# Patient Record
Sex: Female | Born: 1960
Health system: Southern US, Community
[De-identification: ages and names within clinical notes are randomized; demographics above are authoritative.]

## PROBLEM LIST (undated history)

## (undated) DIAGNOSIS — K649 Unspecified hemorrhoids: Secondary | ICD-10-CM

## (undated) DIAGNOSIS — I471 Supraventricular tachycardia, unspecified: Secondary | ICD-10-CM

## (undated) DIAGNOSIS — K449 Diaphragmatic hernia without obstruction or gangrene: Secondary | ICD-10-CM

## (undated) DIAGNOSIS — I10 Essential (primary) hypertension: Secondary | ICD-10-CM

## (undated) DIAGNOSIS — E669 Obesity, unspecified: Secondary | ICD-10-CM

## (undated) DIAGNOSIS — K219 Gastro-esophageal reflux disease without esophagitis: Secondary | ICD-10-CM

## (undated) DIAGNOSIS — E785 Hyperlipidemia, unspecified: Secondary | ICD-10-CM

## (undated) DIAGNOSIS — K635 Polyp of colon: Secondary | ICD-10-CM

## (undated) HISTORY — DX: Obesity, unspecified: E66.9

## (undated) HISTORY — DX: Essential (primary) hypertension: I10

## (undated) HISTORY — DX: Unspecified hemorrhoids: K64.9

## (undated) HISTORY — DX: Polyp of colon: K63.5

## (undated) HISTORY — DX: Hyperlipidemia, unspecified: E78.5

## (undated) HISTORY — DX: Diaphragmatic hernia without obstruction or gangrene: K44.9

## (undated) HISTORY — DX: Supraventricular tachycardia: I47.1

## (undated) HISTORY — DX: Supraventricular tachycardia, unspecified: I47.10

---

## 1998-02-11 ENCOUNTER — Ambulatory Visit (HOSPITAL_COMMUNITY): Admission: RE | Admit: 1998-02-11 | Discharge: 1998-02-11 | Payer: Self-pay | Admitting: Obstetrics & Gynecology

## 1999-03-07 ENCOUNTER — Ambulatory Visit (HOSPITAL_COMMUNITY): Admission: RE | Admit: 1999-03-07 | Discharge: 1999-03-07 | Payer: Self-pay | Admitting: General Surgery

## 1999-03-07 ENCOUNTER — Encounter (HOSPITAL_BASED_OUTPATIENT_CLINIC_OR_DEPARTMENT_OTHER): Payer: Self-pay | Admitting: General Surgery

## 1999-03-14 ENCOUNTER — Ambulatory Visit (HOSPITAL_COMMUNITY): Admission: RE | Admit: 1999-03-14 | Discharge: 1999-03-14 | Payer: Self-pay | Admitting: General Surgery

## 1999-03-14 ENCOUNTER — Encounter (HOSPITAL_BASED_OUTPATIENT_CLINIC_OR_DEPARTMENT_OTHER): Payer: Self-pay | Admitting: General Surgery

## 1999-04-27 ENCOUNTER — Encounter: Admission: RE | Admit: 1999-04-27 | Discharge: 1999-04-27 | Payer: Self-pay | Admitting: Obstetrics

## 1999-06-08 ENCOUNTER — Other Ambulatory Visit: Admission: RE | Admit: 1999-06-08 | Discharge: 1999-06-08 | Payer: Self-pay | Admitting: *Deleted

## 1999-06-08 ENCOUNTER — Encounter: Admission: RE | Admit: 1999-06-08 | Discharge: 1999-06-08 | Payer: Self-pay | Admitting: Obstetrics

## 1999-09-01 ENCOUNTER — Other Ambulatory Visit: Admission: RE | Admit: 1999-09-01 | Discharge: 1999-09-01 | Payer: Self-pay | Admitting: *Deleted

## 1999-09-01 ENCOUNTER — Encounter (INDEPENDENT_AMBULATORY_CARE_PROVIDER_SITE_OTHER): Payer: Self-pay | Admitting: Specialist

## 1999-11-12 ENCOUNTER — Emergency Department (HOSPITAL_COMMUNITY): Admission: EM | Admit: 1999-11-12 | Discharge: 1999-11-12 | Payer: Self-pay | Admitting: Emergency Medicine

## 1999-11-23 ENCOUNTER — Emergency Department (HOSPITAL_COMMUNITY): Admission: EM | Admit: 1999-11-23 | Discharge: 1999-11-23 | Payer: Self-pay | Admitting: Emergency Medicine

## 2000-01-01 HISTORY — PX: ABDOMINAL HYSTERECTOMY: SHX81

## 2000-04-03 ENCOUNTER — Inpatient Hospital Stay (HOSPITAL_COMMUNITY): Admission: RE | Admit: 2000-04-03 | Discharge: 2000-04-05 | Payer: Self-pay | Admitting: Obstetrics and Gynecology

## 2000-04-03 ENCOUNTER — Encounter (INDEPENDENT_AMBULATORY_CARE_PROVIDER_SITE_OTHER): Payer: Self-pay | Admitting: Specialist

## 2000-07-06 ENCOUNTER — Emergency Department (HOSPITAL_COMMUNITY): Admission: EM | Admit: 2000-07-06 | Discharge: 2000-07-06 | Payer: Self-pay | Admitting: Emergency Medicine

## 2000-07-06 ENCOUNTER — Encounter: Payer: Self-pay | Admitting: Emergency Medicine

## 2002-05-18 ENCOUNTER — Emergency Department (HOSPITAL_COMMUNITY): Admission: EM | Admit: 2002-05-18 | Discharge: 2002-05-18 | Payer: Self-pay

## 2002-08-26 ENCOUNTER — Other Ambulatory Visit: Admission: RE | Admit: 2002-08-26 | Discharge: 2002-08-26 | Payer: Self-pay | Admitting: Obstetrics and Gynecology

## 2002-08-27 ENCOUNTER — Encounter: Admission: RE | Admit: 2002-08-27 | Discharge: 2002-08-27 | Payer: Self-pay | Admitting: Obstetrics and Gynecology

## 2002-08-27 ENCOUNTER — Encounter: Payer: Self-pay | Admitting: Obstetrics and Gynecology

## 2003-03-02 ENCOUNTER — Other Ambulatory Visit: Admission: RE | Admit: 2003-03-02 | Discharge: 2003-03-02 | Payer: Self-pay | Admitting: Obstetrics and Gynecology

## 2003-10-29 ENCOUNTER — Emergency Department (HOSPITAL_COMMUNITY): Admission: EM | Admit: 2003-10-29 | Discharge: 2003-10-29 | Payer: Self-pay | Admitting: Emergency Medicine

## 2004-02-24 ENCOUNTER — Emergency Department (HOSPITAL_COMMUNITY): Admission: EM | Admit: 2004-02-24 | Discharge: 2004-02-24 | Payer: Self-pay | Admitting: Emergency Medicine

## 2004-07-12 ENCOUNTER — Ambulatory Visit (HOSPITAL_COMMUNITY): Admission: RE | Admit: 2004-07-12 | Discharge: 2004-07-12 | Payer: Self-pay | Admitting: Internal Medicine

## 2004-07-12 ENCOUNTER — Encounter: Payer: Self-pay | Admitting: Internal Medicine

## 2005-01-22 ENCOUNTER — Ambulatory Visit (HOSPITAL_COMMUNITY): Admission: RE | Admit: 2005-01-22 | Discharge: 2005-01-22 | Payer: Self-pay | Admitting: Obstetrics and Gynecology

## 2005-01-22 ENCOUNTER — Other Ambulatory Visit: Admission: RE | Admit: 2005-01-22 | Discharge: 2005-01-22 | Payer: Self-pay | Admitting: Obstetrics and Gynecology

## 2005-03-02 ENCOUNTER — Encounter: Admission: RE | Admit: 2005-03-02 | Discharge: 2005-03-02 | Payer: Self-pay | Admitting: Obstetrics and Gynecology

## 2005-05-06 ENCOUNTER — Emergency Department (HOSPITAL_COMMUNITY): Admission: EM | Admit: 2005-05-06 | Discharge: 2005-05-06 | Payer: Self-pay | Admitting: Emergency Medicine

## 2005-08-31 ENCOUNTER — Emergency Department (HOSPITAL_COMMUNITY): Admission: EM | Admit: 2005-08-31 | Discharge: 2005-08-31 | Payer: Self-pay | Admitting: Emergency Medicine

## 2006-04-01 ENCOUNTER — Emergency Department (HOSPITAL_COMMUNITY): Admission: EM | Admit: 2006-04-01 | Discharge: 2006-04-01 | Payer: Self-pay | Admitting: Emergency Medicine

## 2006-05-30 ENCOUNTER — Emergency Department (HOSPITAL_COMMUNITY): Admission: EM | Admit: 2006-05-30 | Discharge: 2006-05-30 | Payer: Self-pay | Admitting: Emergency Medicine

## 2006-06-02 ENCOUNTER — Emergency Department (HOSPITAL_COMMUNITY): Admission: EM | Admit: 2006-06-02 | Discharge: 2006-06-02 | Payer: Self-pay | Admitting: Emergency Medicine

## 2007-01-15 ENCOUNTER — Emergency Department (HOSPITAL_COMMUNITY): Admission: EM | Admit: 2007-01-15 | Discharge: 2007-01-15 | Payer: Self-pay | Admitting: Emergency Medicine

## 2007-09-30 ENCOUNTER — Ambulatory Visit: Payer: Self-pay | Admitting: Internal Medicine

## 2008-03-09 DIAGNOSIS — R079 Chest pain, unspecified: Secondary | ICD-10-CM

## 2008-03-09 DIAGNOSIS — R1013 Epigastric pain: Secondary | ICD-10-CM | POA: Insufficient documentation

## 2008-03-09 DIAGNOSIS — I10 Essential (primary) hypertension: Secondary | ICD-10-CM | POA: Insufficient documentation

## 2008-03-09 DIAGNOSIS — K222 Esophageal obstruction: Secondary | ICD-10-CM | POA: Insufficient documentation

## 2008-03-09 DIAGNOSIS — K210 Gastro-esophageal reflux disease with esophagitis: Secondary | ICD-10-CM

## 2008-03-09 DIAGNOSIS — Z8679 Personal history of other diseases of the circulatory system: Secondary | ICD-10-CM | POA: Insufficient documentation

## 2008-03-09 DIAGNOSIS — E669 Obesity, unspecified: Secondary | ICD-10-CM

## 2008-03-09 DIAGNOSIS — K219 Gastro-esophageal reflux disease without esophagitis: Secondary | ICD-10-CM | POA: Insufficient documentation

## 2008-03-09 DIAGNOSIS — K449 Diaphragmatic hernia without obstruction or gangrene: Secondary | ICD-10-CM

## 2008-04-21 ENCOUNTER — Encounter: Admission: RE | Admit: 2008-04-21 | Discharge: 2008-04-21 | Payer: Self-pay | Admitting: Internal Medicine

## 2010-02-16 ENCOUNTER — Encounter: Admission: RE | Admit: 2010-02-16 | Discharge: 2010-02-16 | Payer: Self-pay | Admitting: Gynecology

## 2010-02-17 ENCOUNTER — Other Ambulatory Visit: Admission: RE | Admit: 2010-02-17 | Discharge: 2010-02-17 | Payer: Self-pay | Admitting: Gynecology

## 2010-02-17 ENCOUNTER — Ambulatory Visit: Payer: Self-pay | Admitting: Gynecology

## 2010-02-28 ENCOUNTER — Ambulatory Visit: Payer: Self-pay | Admitting: Gynecology

## 2010-08-29 ENCOUNTER — Ambulatory Visit: Payer: Self-pay | Admitting: Gynecology

## 2010-11-06 ENCOUNTER — Encounter (INDEPENDENT_AMBULATORY_CARE_PROVIDER_SITE_OTHER): Payer: Self-pay | Admitting: *Deleted

## 2010-11-29 ENCOUNTER — Encounter (INDEPENDENT_AMBULATORY_CARE_PROVIDER_SITE_OTHER): Payer: Self-pay | Admitting: *Deleted

## 2010-11-30 ENCOUNTER — Ambulatory Visit: Payer: Self-pay | Admitting: Internal Medicine

## 2010-12-12 ENCOUNTER — Ambulatory Visit: Payer: Self-pay | Admitting: Internal Medicine

## 2010-12-15 ENCOUNTER — Encounter: Payer: Self-pay | Admitting: Internal Medicine

## 2011-01-20 ENCOUNTER — Other Ambulatory Visit: Payer: Self-pay | Admitting: Gynecology

## 2011-01-20 DIAGNOSIS — Z1239 Encounter for other screening for malignant neoplasm of breast: Secondary | ICD-10-CM

## 2011-01-21 ENCOUNTER — Encounter: Payer: Self-pay | Admitting: Obstetrics and Gynecology

## 2011-01-21 ENCOUNTER — Encounter: Payer: Self-pay | Admitting: Internal Medicine

## 2011-01-21 ENCOUNTER — Encounter: Payer: Self-pay | Admitting: *Deleted

## 2011-01-30 NOTE — Miscellaneous (Signed)
Summary: [facility]  Clinical Lists Changes  Medications: Added new medication of MOVIPREP 100 GM  SOLR (PEG-KCL-NACL-NASULF-NA ASC-C) As per prep instructions. - Signed Rx of MOVIPREP 100 GM  SOLR (PEG-KCL-NACL-NASULF-NA ASC-C) As per prep instructions.;  #1 x 0;  Signed;  Entered by: Ezra Sites RN;  Authorized by: Hilarie Fredrickson MD;  Method used: Electronically to [facility]. [phone]*, [address], [place], [place]  [address], Ph: [phone], Fax: [phone] Observations: Added new observation of NKA: T (11/30/2010 16:06)    Prescriptions: MOVIPREP 100 GM  SOLR (PEG-KCL-NACL-NASULF-NA ASC-C) As per prep instructions.  #1 x 0   Entered by:   Ezra Sites RN   Authorized by:   Hilarie Fredrickson MD   Signed by:   Ezra Sites RN on 11/30/2010   Method used:   Electronically to        [facility] [address] [phone]* (retail)       [address] [place], [place]  [address]       Ph: [phone]       Fax: [phone]   RxID:   [id number]

## 2011-01-30 NOTE — Letter (Signed)
Summary: [facility] Instructions  [facility]  [address] [place], [place] [address]   Phone: [phone]  Fax: [phone]       ANARI EVITT    February 09, 1970    MRN: [id number]        Procedure Day /Date:  Tuesday 12/12/2010     Arrival Time: 12:30 pm      Procedure Time: 1:30 pm     Location of Procedure:                    _x _  [facility] (4th Floor)                        PREPARATION FOR COLONOSCOPY WITH MOVIPREP   Starting 5 days prior to your procedure Thursday 12/8 do not eat nuts, seeds, popcorn, corn, beans, peas,  salads, or any raw vegetables.  Do not take any fiber supplements (e.g. Metamucil, Citrucel, and Benefiber).  THE DAY BEFORE YOUR PROCEDURE         DATE: Monday 12/12  1.  Drink clear liquids the entire day-NO SOLID FOOD  2.  Do not drink anything colored red or purple.  Avoid juices with pulp.  No orange juice.  3.  Drink at least 64 oz. (8 glasses) of fluid/clear liquids during the day to prevent dehydration and help the prep work efficiently.  CLEAR LIQUIDS INCLUDE: Water Jello Ice Popsicles Tea (sugar ok, no milk/cream) Powdered fruit flavored drinks Coffee (sugar ok, no milk/cream) Gatorade Juice: apple, white grape, white cranberry  Lemonade Clear bullion, consomm, broth Carbonated beverages (any kind) Strained chicken noodle soup Hard Candy                             4.  In the morning, mix first dose of MoviPrep solution:    Empty 1 Pouch A and 1 Pouch B into the disposable container    Add lukewarm drinking water to the top line of the container. Mix to dissolve    Refrigerate (mixed solution should be used within 24 hrs)  5.  Begin drinking the prep at 5:00 p.m. The MoviPrep container is divided by 4 marks.   Every 15 minutes drink the solution down to the next mark (approximately 8 oz) until the full liter is complete.   6.  Follow completed prep with 16 oz of clear liquid of your choice  (Nothing red or purple).  Continue to drink clear liquids until bedtime.  7.  Before going to bed, mix second dose of MoviPrep solution:    Empty 1 Pouch A and 1 Pouch B into the disposable container    Add lukewarm drinking water to the top line of the container. Mix to dissolve    Refrigerate  THE DAY OF YOUR PROCEDURE      DATE: Tuesday 12/13  Beginning at 8:30 a.m. (5 hours before procedure):         1. Every 15 minutes, drink the solution down to the next mark (approx 8 oz) until the full liter is complete.  2. Follow completed prep with 16 oz. of clear liquid of your choice.    3. You may drink clear liquids until 11:30 am (2 HOURS BEFORE PROCEDURE).   MEDICATION INSTRUCTIONS  Unless otherwise instructed, you should take regular prescription medications with a small sip of water   as early as possible the morning of  your procedure.           OTHER INSTRUCTIONS  You will need a responsible adult at least 51 years of age to accompany you and drive you home.   This person must remain in the waiting room during your procedure.  Wear loose fitting clothing that is easily removed.  Leave jewelry and other valuables at home.  However, you may wish to bring a book to read or  an iPod/MP3 player to listen to music as you wait for your procedure to start.  Remove all body piercing jewelry and leave at home.  Total time from sign-in until discharge is approximately 2-3 hours.  You should go home directly after your procedure and rest.  You can resume normal activities the  day after your procedure.  The day of your procedure you should not:   Drive   Make legal decisions   Operate machinery   Drink alcohol   Return to work  You will receive specific instructions about eating, activities and medications before you leave.    The above instructions have been reviewed and explained to me by   Ezra Sites RN  November 30, 2010 4:27 PM     I fully understand  and can verbalize these instructions _____________________________ Date _________

## 2011-01-30 NOTE — Letter (Signed)
Summary: Pre Visit Letter Revised  [facility]  [address] [place], [place] [address]   Phone: [phone]  Fax: [phone]        11/06/2010 MRN: [id number] Anita Hughes [address] [place], [place]  [address]             Procedure Date:  12/12/2010   Welcome to the Gastroenterology Division at [facility].    You are scheduled to see a nurse for your pre-procedure visit on 11/30/2010 at 4:30PM on the 3rd floor at [facility], [address]  We ask that you try to arrive at our office 15 minutes prior to your appointment time to allow for check-in.  Please take a minute to review the attached form.  If you answer "Yes" to one or more of the questions on the first page, we ask that you call the person listed at your earliest opportunity.  If you answer "No" to all of the questions, please complete the rest of the form and bring it to your appointment.    Your nurse visit will consist of discussing your medical and surgical history, your immediate family medical history, and your medications.   If you are unable to list all of your medications on the form, please bring the medication bottles to your appointment and we will list them.  We will need to be aware of both prescribed and over the counter drugs.  We will need to know exact dosage information as well.    Please be prepared to read and sign documents such as consent forms, a financial agreement, and acknowledgement forms.  If necessary, and with your consent, a friend or relative is welcome to sit-in on the nurse visit with you.  Please bring your insurance card so that we may make a copy of it.  If your insurance requires a referral to see a specialist, please bring your referral form from your primary care physician.  No co-pay is required for this nurse visit.     If you cannot keep your appointment, please call [phone] to cancel or reschedule prior to your appointment date.  This  allows [place] the opportunity to schedule an appointment for another patient in need of care.    Thank you for choosing [facility] for your medical needs.  We appreciate the opportunity to care for you.  Please visit [place] at our website  to learn more about our practice.  Sincerely, The Gastroenterology Division

## 2011-02-01 NOTE — Procedures (Signed)
Summary: Colonoscopy  Patient: Anita Hughes Note: All result statuses are Final unless otherwise noted.  Tests: (1) Colonoscopy (COL)   COL Colonoscopy           DONE     [facility]     [address]     [place], [place]  [address]           COLONOSCOPY PROCEDURE REPORT           PATIENT:  [name], [name]  MR#:  [id number]     BIRTHDATE:  24-Aug-1960, 50 yrs. old  GENDER:  female     ENDOSCOPIST:  Wilhemina Bonito. Eda Keys, MD     REF. BY:  .Direct Self,     PROCEDURE DATE:  12/12/2010     PROCEDURE:  Colonoscopy with snare polypectomy x 1     ASA CLASS:  Class I     INDICATIONS:  Routine Risk Screening     MEDICATIONS:   Fentanyl 100 mcg IV, Versed 13 mg IV           DESCRIPTION OF PROCEDURE:   After the risks benefits and     alternatives of the procedure were thoroughly explained, informed     consent was obtained.  Digital rectal exam was performed and     revealed no abnormalities.   The LB 180AL [id number] endoscope was     introduced through the anus and advanced to the cecum, which was     identified by both the appendix and ileocecal valve, without     limitations.Time to cecum = 5:68min. The quality of the prep was     excellent, using MoviPrep.  The instrument was then slowly     withdrawn (time = 10:15 min) as the colon was fully examined.     <<PROCEDUREIMAGES>>           FINDINGS:  A diminutive polyp was found in the rectum. Polyp was     snared without cautery. Retrieval was successful. snare polyp     Moderate diverticulosis was found in the left colon.   Retroflexed     views in the rectum revealed internal hemorrhoids.    The scope     was then withdrawn from the patient and the procedure completed.     COMPLICATIONS:  None     ENDOSCOPIC IMPRESSION:     1) Diminutive polyp in the rectum - removed     2) Moderate diverticulosis in the left colon     3) Internal hemorrhoids     RECOMMENDATIONS:     1) If polyp adenomatous, repeat colonoscopy in 5  years;     Otherwise continue current colorectal screening recommendations     for "routine risk" patients with a repeat colonoscopy in 10 years.           NOTE: YOUR BLOOD PRESSURE WAS HIGH BEFORE, DURING, AND AFTER YOUR     PROCEDURE. PLEASE SEE YOU PRIMARY PHYSICIAN REGARDING THIS     (discussed with husband)           _____________________________     Wilhemina Bonito. Eda Keys, MD           CC:  The Patient           n.     eSIGNED:   Wilhemina Bonito. Eda Keys at 12/12/2010 02:07 PM           Reita May, [id number]  Note: An exclamation mark (!) indicates a  result that was not dispersed into the flowsheet. Document Creation Date: 12/12/2010 2:07 PM _______________________________________________________________________  (1) Order result status: Final Collection or observation date-time: 12/12/2010 13:56 Requested date-time:  Receipt date-time:  Reported date-time:  Referring Physician:   Ordering Physician: Fransico Setters [phone]) Specimen Source:  Source: Launa Grill Order Number: [id number] Lab site:   Appended Document: Colonoscopy recall     Procedures Next Due Date:    Colonoscopy: 11/2020

## 2011-02-01 NOTE — Letter (Signed)
Summary: Patient Notice- Polyp Results  [facility]  [address] [place], [place] [address]   Phone: [phone]  Fax: [phone]        December 15, 2010 MRN: [id number]    Anita Hughes [address] [place], [place]  [address]    Dear [name],  I am pleased to inform you that the colon polyp(s) removed during your recent colonoscopy was (were) found to be benign (no cancer detected) upon pathologic examination.  I recommend you have a repeat colonoscopy examination in 10 years to look for recurrent polyps, as having colon polyps increases your risk for having recurrent polyps or even colon cancer in the future.  Should you develop new or worsening symptoms of abdominal pain, bowel habit changes or bleeding from the rectum or bowels, please schedule an evaluation with either your primary care physician or with me.  Additional information/recommendations:  __ No further action with gastroenterology is needed at this time. Please      follow-up with your primary care physician for your other healthcare      needs.    Please call us if you are having persistent problems or have questions about your condition that have not been fully answered at this time.  Sincerely,  Hilarie Fredrickson MD  This letter has been electronically signed by your physician.  Appended Document: Patient Notice- Polyp Results Letter Mailed

## 2011-02-19 ENCOUNTER — Ambulatory Visit
Admission: RE | Admit: 2011-02-19 | Discharge: 2011-02-19 | Disposition: A | Discharge status: Home / Self Care | Payer: No Typology Code available for payment source | Source: Ambulatory Visit | Attending: Gynecology | Admitting: Gynecology

## 2011-02-19 DIAGNOSIS — Z1239 Encounter for other screening for malignant neoplasm of breast: Secondary | ICD-10-CM

## 2011-02-20 ENCOUNTER — Encounter (INDEPENDENT_AMBULATORY_CARE_PROVIDER_SITE_OTHER): Payer: No Typology Code available for payment source | Admitting: Gynecology

## 2011-02-20 ENCOUNTER — Other Ambulatory Visit (HOSPITAL_COMMUNITY)
Admission: RE | Admit: 2011-02-20 | Discharge: 2011-02-20 | Disposition: A | Discharge status: Home / Self Care | Payer: No Typology Code available for payment source | Source: Ambulatory Visit | Attending: Gynecology | Admitting: Gynecology

## 2011-02-20 DIAGNOSIS — Z13220 Encounter for screening for lipoid disorders: Secondary | ICD-10-CM

## 2011-02-20 DIAGNOSIS — Z833 Family history of diabetes mellitus: Secondary | ICD-10-CM

## 2011-02-20 DIAGNOSIS — Z113 Encounter for screening for infections with a predominantly sexual mode of transmission: Secondary | ICD-10-CM

## 2011-02-20 DIAGNOSIS — Z01419 Encounter for gynecological examination (general) (routine) without abnormal findings: Secondary | ICD-10-CM

## 2011-02-20 DIAGNOSIS — R635 Abnormal weight gain: Secondary | ICD-10-CM

## 2011-02-20 DIAGNOSIS — Z124 Encounter for screening for malignant neoplasm of cervix: Secondary | ICD-10-CM | POA: Insufficient documentation

## 2011-02-21 ENCOUNTER — Other Ambulatory Visit: Payer: Self-pay | Admitting: Gynecology

## 2011-02-28 ENCOUNTER — Ambulatory Visit: Payer: No Typology Code available for payment source

## 2011-03-23 ENCOUNTER — Other Ambulatory Visit: Payer: No Typology Code available for payment source

## 2011-05-15 NOTE — Assessment & Plan Note (Signed)
[facility]                         GASTROENTEROLOGY OFFICE NOTE   NAME:BYNUMEstel, Tonelli                   MRN:          [id number]  DATE:09/30/2007                            DOB:          [birth year]    OFFICE CONSULTATION NOTE.   Patient is self-referred.   REASON FOR CONSULTATION:  Request refill of reflux medications.   HISTORY:  This is a 51 year old African-American female who was  evaluated in this office in May of 2002 with epigastric and chest  discomfort.  She was felt to have reflux disease.  She underwent upper  endoscopy July 25, 2001.  She was found to have esophagitis and a hiatal  hernia.  She was placed on Prevacid.  Prevacid controlled the heartburn  and indigestion.  She was subsequently evaluated July 06, 2004, for  dysphagia.  Repeat endoscopy July 12, 2004, revealed a distal esophageal  stricture and a hiatal hernia.  She was dilated to 18 mm and continued  on Prevacid.  While on Prevacid she reports good control of heartburn  and indigestion.  She has had no recurrent dysphagia.  Unfortunately,  due to unemployment, she lost her insurance and was not on proton pump  inhibitors.  She has continued with intermittent reflux symptoms.  She  has been on Nexium previously with suboptimal results or intolerance.  Over the past several months her reflux has gotten progressively worse.  She is now employed, with insurance, and presents requesting a  prescription for reflux medication.  GI review of systems is otherwise  negative.  She does have occasional dark stools, depending upon what she  eats.  Occasional mild constipation which states is helped by her  Prevacid.  No bleeding or other problems.  No weight loss.   PAST MEDICAL HISTORY:  Hypertension.   PAST SURGICAL HISTORY:  Hysterectomy.   CURRENT MEDICATIONS:  None.   ALLERGIES:  No known drug allergies.   FAMILY HISTORY:  Negative for gastrointestinal malignancy.   SOCIAL HISTORY:  1. Married with 5 children.  2. Lives with her husband.  3. 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  4. Does not smoke or use alcohol.   REVIEW OF SYSTEMS:  Per Diagnostic Evaluation Form.   PHYSICAL EXAMINATION:  A well-appearing female in no acute distress.  Blood pressure is 122/90, heart rate is 84 and regular, weight is 218  pounds.  She is 4 feet 11 and 1/2 inches in height.  HEENT:  Sclerae are anicteric.  Conjunctivae are pink.  Oral mucosa is  intact.  No adenopathy.  LUNGS:  Clear.  HEART:  Regular.  ABDOMEN:  Obese and soft, without tenderness, mass or hernia.  Good  bowel sounds heard.   IMPRESSION:  1. Gastroesophageal reflux disease with a history of peptic stricture.      Currently recurrent heartburn and indigestion off proton pump      inhibitors.  No dysphagia.  2. General medical care.  No primary Latiana Tomei.   RECOMMENDATION:  1. Resume proton pump inhibitor.  A prescription for Prevacid 30 mg      daily with multiple refills  as well as multiple samples have been      provided.  2. Reflux precautions with attention to weight loss.  3. Anticipate screening colonoscopy at age 80 unless otherwise      clinically indicated.  4. Strictly advised to secure a primary care physician to attend to      her general medical needs and overall supervision of her general      healthcare.  She acknowledged the recommendation and agrees to find      a PCP.     Wilhemina Bonito. Marina Goodell, MD  Electronically Signed    JNP/MedQ  DD: 09/30/2007  DT: 09/30/2007  Job #: [id number]

## 2012-03-10 DIAGNOSIS — I10 Essential (primary) hypertension: Secondary | ICD-10-CM | POA: Insufficient documentation

## 2012-03-21 ENCOUNTER — Ambulatory Visit (INDEPENDENT_AMBULATORY_CARE_PROVIDER_SITE_OTHER): Payer: No Typology Code available for payment source | Admitting: Gynecology

## 2012-03-21 ENCOUNTER — Encounter: Payer: Self-pay | Admitting: Gynecology

## 2012-03-21 VITALS — BP 134/90 | Ht 60.0 in | Wt 230.0 lb

## 2012-03-21 DIAGNOSIS — Z01419 Encounter for gynecological examination (general) (routine) without abnormal findings: Secondary | ICD-10-CM

## 2012-03-21 DIAGNOSIS — I10 Essential (primary) hypertension: Secondary | ICD-10-CM

## 2012-03-21 DIAGNOSIS — Z131 Encounter for screening for diabetes mellitus: Secondary | ICD-10-CM

## 2012-03-21 DIAGNOSIS — Z13220 Encounter for screening for lipoid disorders: Secondary | ICD-10-CM

## 2012-03-21 NOTE — Patient Instructions (Signed)
Follow up for lab work. Continue see her primary physician for her hypertension follow up. Return in one year for annual gynecologic follow up.

## 2012-03-21 NOTE — Progress Notes (Signed)
Anita Hughes 02/10/1960 [id number]        52 y.o.  for annual exam.  Several issues noted below.  Past medical history,surgical history, medications, allergies, family history and social history were all reviewed and documented in the EPIC chart. ROS:  Was performed and pertinent positives and negatives are included in the history.  Exam: Anita Hughes chaperone present Filed Vitals:   03/21/12 1459  BP: 134/90   General appearance  Normal Skin grossly normal Head/Neck normal with no cervical or supraclavicular adenopathy thyroid normal Lungs  clear Cardiac RR, without RMG Abdominal  soft, nontender, without masses, organomegaly or hernia Breasts  examined lying and sitting without masses, retractions, discharge or axillary adenopathy. Pelvic  Ext/BUS/vagina  normal   Adnexa  Without masses or tenderness    Anus and perineum  normal   Rectovaginal  normal sphincter tone without palpated masses or tenderness.    Assessment/Plan:  52 y.o. female for annual exam.    1. Patient is status post hysterectomy for leiomyoma doing well.  Was having hot flashes but notes that these have resolved. She does have an elevated FSH in the past. we will go ahead and recheck her [facility] now. Otherwise she'll continue as is as she is doing well. 2. Pap smear. A Pap smear was done today. She is status post hysterectomy for benign indications and has 2 normal Pap smears in the chart of the cuff in 2011& 2012. Current screening guidelines were reviewed and the option to stop altogether was discussed and we'll readdress this on an annual basis. 3. Mammography. Patient had her mammogram today. SBE monthly reviewed continued annual mammography. 4. Colonoscopy. Patient had her colonoscopy last year we'll continue per the guidelines. 5. Bone density. Patient has never had a bone density. She is low risk historically and will wait further into menopause to schedule her baseline. Will check baseline vitamin D. 6. Health  maintenance. Baseline CBC lipid profile comprehensive metabolic panel urinalysis and vitamin D level were ordered.  She is on hydrochlorothiazide for hypertension by her primary and notes that she has not had routine blood work done in a while. Assuming she continues well from a gynecologic standpoint and she will see me in a year, sooner as needed.    Anita Lords MD, 5:02 PM 03/21/2012

## 2012-03-22 LAB — CBC WITH DIFFERENTIAL/PLATELET
Basophils Absolute: 0 10*3/uL (ref 0.0–0.1)
Hemoglobin: 13.5 g/dL (ref 12.0–15.0)
Lymphocytes Relative: 36 % (ref 12–46)
MCH: 29 pg (ref 26.0–34.0)
MCV: 87.1 fL (ref 78.0–100.0)
Monocytes Relative: 8 % (ref 3–12)
Neutro Abs: 3.6 10*3/uL (ref 1.7–7.7)
Neutrophils Relative %: 54 % (ref 43–77)

## 2012-03-22 LAB — URINALYSIS W MICROSCOPIC + REFLEX CULTURE
Bacteria, UA: NONE SEEN
Bilirubin Urine: NEGATIVE
Hgb urine dipstick: NEGATIVE
Ketones, ur: NEGATIVE mg/dL
Protein, ur: NEGATIVE mg/dL
Specific Gravity, Urine: 1.023 (ref 1.005–1.030)
pH: 5.5 (ref 5.0–8.0)

## 2012-03-22 LAB — COMPREHENSIVE METABOLIC PANEL
ALT: 19 U/L (ref 0–35)
Alkaline Phosphatase: 64 U/L (ref 39–117)
BUN: 13 mg/dL (ref 6–23)
CO2: 26 mEq/L (ref 19–32)
Calcium: 9.6 mg/dL (ref 8.4–10.5)
Chloride: 99 mEq/L (ref 96–112)
Glucose, Bld: 93 mg/dL (ref 70–99)
Potassium: 3.6 mEq/L (ref 3.5–5.3)
Total Protein: 7.3 g/dL (ref 6.0–8.3)

## 2012-03-22 LAB — VITAMIN D 25 HYDROXY (VIT D DEFICIENCY, FRACTURES): Vit D, 25-Hydroxy: 13 ng/mL — ABNORMAL LOW (ref 30–89)

## 2012-03-22 LAB — LIPID PANEL
HDL: 50 mg/dL (ref >39)
Triglycerides: 98 mg/dL (ref <150)

## 2012-03-24 ENCOUNTER — Other Ambulatory Visit: Payer: Self-pay

## 2012-03-24 MED ORDER — ERGOCALCIFEROL 1.25 MG (50000 UT) PO CAPS: 50000.0000 [IU] | ORAL_CAPSULE | ORAL | Status: AC

## 2012-03-24 NOTE — Progress Notes (Signed)
Addended by: Dara Lords on: 03/24/2012 09:28 AM   Modules accepted: Orders

## 2012-03-24 NOTE — Progress Notes (Signed)
Patient's lipid profile results faxed to Dr. Dorothyann Peng and she has appt there on 04/02/12.

## 2013-07-15 ENCOUNTER — Emergency Department (HOSPITAL_COMMUNITY)
Admission: EM | Admit: 2013-07-15 | Discharge: 2013-07-15 | Discharge status: Against Medical Advice | Payer: Self-pay | Attending: Emergency Medicine | Admitting: Emergency Medicine

## 2013-07-15 ENCOUNTER — Encounter (HOSPITAL_COMMUNITY): Payer: Self-pay

## 2013-07-15 DIAGNOSIS — R109 Unspecified abdominal pain: Secondary | ICD-10-CM | POA: Insufficient documentation

## 2013-07-15 DIAGNOSIS — M545 Low back pain, unspecified: Secondary | ICD-10-CM | POA: Insufficient documentation

## 2013-07-15 DIAGNOSIS — Z79899 Other long term (current) drug therapy: Secondary | ICD-10-CM | POA: Insufficient documentation

## 2013-07-15 DIAGNOSIS — I10 Essential (primary) hypertension: Secondary | ICD-10-CM | POA: Insufficient documentation

## 2013-07-15 DIAGNOSIS — E669 Obesity, unspecified: Secondary | ICD-10-CM | POA: Insufficient documentation

## 2013-07-15 DIAGNOSIS — K219 Gastro-esophageal reflux disease without esophagitis: Secondary | ICD-10-CM | POA: Insufficient documentation

## 2013-07-15 DIAGNOSIS — Z90710 Acquired absence of both cervix and uterus: Secondary | ICD-10-CM | POA: Insufficient documentation

## 2013-07-15 HISTORY — DX: Gastro-esophageal reflux disease without esophagitis: K21.9

## 2013-07-15 LAB — COMPREHENSIVE METABOLIC PANEL
ALT: 23 U/L (ref 0–35)
Albumin: 3.4 g/dL — ABNORMAL LOW (ref 3.5–5.2)
CO2: 27 mEq/L (ref 19–32)
Glucose, Bld: 97 mg/dL (ref 70–99)
Sodium: 141 mEq/L (ref 135–145)

## 2013-07-15 LAB — URINALYSIS, ROUTINE W REFLEX MICROSCOPIC
Bilirubin Urine: NEGATIVE
Glucose, UA: NEGATIVE mg/dL
Hgb urine dipstick: NEGATIVE
Ketones, ur: NEGATIVE mg/dL
pH: 6 (ref 5.0–8.0)

## 2013-07-15 LAB — CBC WITH DIFFERENTIAL/PLATELET
Basophils Relative: 0 % (ref 0–1)
Eosinophils Absolute: 0.1 10*3/uL (ref 0.0–0.7)
Eosinophils Relative: 2 % (ref 0–5)
HCT: 39.2 % (ref 36.0–46.0)
Hemoglobin: 13 g/dL (ref 12.0–15.0)
Lymphocytes Relative: 46 % (ref 12–46)
Lymphs Abs: 2.7 10*3/uL (ref 0.7–4.0)
MCV: 86.3 fL (ref 78.0–100.0)
Platelets: 306 10*3/uL (ref 150–400)
RBC: 4.54 MIL/uL (ref 3.87–5.11)
WBC: 5.8 10*3/uL (ref 4.0–10.5)

## 2013-07-15 LAB — URINE MICROSCOPIC-ADD ON

## 2013-07-15 NOTE — ED Provider Notes (Signed)
   History    CSN: [id number] Arrival date & time 07/15/13  0808  First MD Initiated Contact with Patient 07/15/13 [phone]     Chief Complaint  Patient presents with  . Abdominal Pain  . Back Pain   (Consider location/radiation/quality/duration/timing/severity/associated sxs/prior Treatment) HPI.... sharp lower abdominal pain bilateral for 3 days without radiation.   No dysuria, fever, chills, diarrhea, vomiting, vaginal bleeding, vaginal discharge. Patient is a former heavy menstrual periods. Severity is mild.  Nothing makes symptoms better or worse Past Medical History  Diagnosis Date  . Hypertension   . GERD (gastroesophageal reflux disease)    Past Surgical History  Procedure Laterality Date  . Abdominal hysterectomy  2001    FIBROIDS  . Cesarean section  1990   Family History  Problem Relation Age of Onset  . Hypertension Mother    History  Substance Use Topics  . Smoking status: Never Smoker   . Smokeless tobacco: Never Used  . Alcohol Use: No   OB History   Grav Para Term Preterm Abortions TAB SAB Ect Mult Living   4 4 4       4      Review of Systems  All other systems reviewed and are negative.    Allergies  Review of patient's allergies indicates no known allergies.  Home Medications   Current Outpatient Rx  Name  Route  Sig  Dispense  Refill  . hydrochlorothiazide (HYDRODIURIL) 25 MG tablet   Oral   Take 25 mg by mouth daily.         . Lansoprazole (PREVACID PO)   Oral   Take by mouth.          LMP 03/21/2000 Physical Exam  Nursing note and vitals reviewed. Constitutional: She is oriented to person, place, and time. She appears well-developed and well-nourished.  Obese  HENT:  Head: Normocephalic and atraumatic.  Eyes: Conjunctivae and EOM are normal. Pupils are equal, round, and reactive to light.  Neck: Normal range of motion. Neck supple.  Cardiovascular: Normal rate, regular rhythm and normal heart sounds.   Pulmonary/Chest: Effort  normal and breath sounds normal.  Abdominal: Soft. Bowel sounds are normal.  Minimal bilateral lower abdominal tenderness in the lateral aspects of the abdomen  Musculoskeletal: Normal range of motion.  Neurological: She is alert and oriented to person, place, and time.  Skin: Skin is warm and dry.  Psychiatric: She has a normal mood and affect.    ED Course  Procedures (including critical care time) Labs Reviewed  URINALYSIS, ROUTINE W REFLEX MICROSCOPIC - Abnormal; Notable for the following:    APPearance CLOUDY (*)    Leukocytes, UA SMALL (*)    All other components within normal limits  URINE MICROSCOPIC-ADD ON - Abnormal; Notable for the following:    Squamous Epithelial / LPF FEW (*)    All other components within normal limits  COMPREHENSIVE METABOLIC PANEL - Abnormal; Notable for the following:    Albumin 3.4 (*)    Total Bilirubin 0.2 (*)    All other components within normal limits  CBC WITH DIFFERENTIAL   No results found. 1. Abdominal pain     MDM  No acute abdomen. Patient left AMA prior to discussion with patient about test results  Donnetta Hutching, MD 07/15/13 1230

## 2013-07-15 NOTE — Progress Notes (Signed)
[facility] has seen patient and provided her with a [facility] orange card application. Patient stated that she did have a PCP, Dr. Roseanne Reno, [facility].

## 2013-07-15 NOTE — ED Notes (Signed)
Pt found walking in hall with clothes on and bag in hand stating she was ready to leave. Explain to pt why she should stay but pt still decided to leave. Gown left in rm and not pt belongings found.

## 2013-07-15 NOTE — ED Notes (Addendum)
Pt c/o lower back pain, generalized abdominal pain and nausea x 3 days.  Denies v/d.  Pain score 7/10.  Denies GU complaints.  NAD noted.

## 2013-12-03 ENCOUNTER — Emergency Department (HOSPITAL_COMMUNITY)
Admission: EM | Admit: 2013-12-03 | Discharge: 2013-12-03 | Disposition: A | Discharge status: Home / Self Care | Payer: No Typology Code available for payment source | Attending: Emergency Medicine | Admitting: Emergency Medicine

## 2013-12-03 ENCOUNTER — Encounter (HOSPITAL_COMMUNITY): Payer: Self-pay | Admitting: Emergency Medicine

## 2013-12-03 DIAGNOSIS — I10 Essential (primary) hypertension: Secondary | ICD-10-CM | POA: Insufficient documentation

## 2013-12-03 DIAGNOSIS — M25532 Pain in left wrist: Secondary | ICD-10-CM

## 2013-12-03 DIAGNOSIS — Z8719 Personal history of other diseases of the digestive system: Secondary | ICD-10-CM | POA: Insufficient documentation

## 2013-12-03 DIAGNOSIS — Z79899 Other long term (current) drug therapy: Secondary | ICD-10-CM | POA: Insufficient documentation

## 2013-12-03 DIAGNOSIS — M25539 Pain in unspecified wrist: Secondary | ICD-10-CM | POA: Insufficient documentation

## 2013-12-03 MED ORDER — OXYCODONE-ACETAMINOPHEN 5-325 MG PO TABS: 2.0000 | ORAL_TABLET | ORAL | Status: DC | PRN

## 2013-12-03 MED ORDER — PREDNISONE 20 MG PO TABS
60.0000 mg | ORAL_TABLET | Freq: Once | ORAL | Status: AC
  Administered 2013-12-03: 60 mg via ORAL
  Filled 2013-12-03: qty 3

## 2013-12-03 MED ORDER — PREDNISONE 10 MG PO TABS: 20.0000 mg | ORAL_TABLET | Freq: Every day | ORAL | Status: DC

## 2013-12-03 NOTE — ED Notes (Signed)
Patient with c/o left wrist, elbow pain Dr. Freida Busman at bedside

## 2013-12-03 NOTE — ED Provider Notes (Signed)
CSN: [id number]     Arrival date & time 12/03/13  0545 History   First MD Initiated Contact with Patient 12/03/13 [phone]     Chief Complaint  Patient presents with  . Wrist Pain   (Consider location/radiation/quality/duration/timing/severity/associated sxs/prior Treatment) Patient is a 53 y.o. female presenting with wrist pain. The history is provided by the patient.  Wrist Pain   patient complains of left wrist pain x24 hours characterized as sharp pain and worse with movement of her wrist. History of arthritis rheumatoid variety. Denies any recent trauma to her wrist. No fever or chills. No redness to the joint. Symptoms are better with rest. No rashes to the skin. No prior history of arthritis in that joint.  Past Medical History  Diagnosis Date  . Hypertension   . GERD (gastroesophageal reflux disease)    Past Surgical History  Procedure Laterality Date  . Abdominal hysterectomy  2001    FIBROIDS  . Cesarean section  1990   Family History  Problem Relation Age of Onset  . Hypertension Mother    History  Substance Use Topics  . Smoking status: Never Smoker   . Smokeless tobacco: Never Used  . Alcohol Use: No   OB History   Grav Para Term Preterm Abortions TAB SAB Ect Mult Living   4 4 4       4      Review of Systems  All other systems reviewed and are negative.    Allergies  Review of patient's allergies indicates no known allergies.  Home Medications   Current Outpatient Rx  Name  Route  Sig  Dispense  Refill  . acetaminophen (TYLENOL) 500 MG tablet   Oral   Take 1,000 mg by mouth once.         . hydrochlorothiazide (HYDRODIURIL) 25 MG tablet   Oral   Take 25 mg by mouth daily.          BP 155/95  Pulse 110  Temp(Src) 98.1 F (36.7 C) (Oral)  Resp 19  Ht 5' (1.524 m)  Wt 240 lb (108.863 kg)  BMI 46.87 kg/m2  SpO2 100%  LMP 03/21/2000 Physical Exam  Nursing note and vitals reviewed. Constitutional: She is oriented to person, place, and  time. She appears well-developed and well-nourished.  Non-toxic appearance.  HENT:  Head: Normocephalic and atraumatic.  Eyes: Conjunctivae are normal. Pupils are equal, round, and reactive to light.  Neck: Normal range of motion.  Cardiovascular: Normal rate.   Pulmonary/Chest: Effort normal.  Musculoskeletal:       Left wrist: She exhibits tenderness. She exhibits no swelling.       Arms: Neurological: She is alert and oriented to person, place, and time.  Skin: Skin is warm and dry.  Psychiatric: She has a normal mood and affect.    ED Course  Procedures (including critical care time) Labs Review Labs Reviewed - No data to display Imaging Review No results found.  EKG Interpretation   None       MDM  No diagnosis found. Patient without signs of septic joint at this time. Suspect this is an exacerbation of her rheumatoid disease. Patient given prednisone here and will begin prescription for same as well as opiate medications and will see her Dr. as needed    Toy Baker, MD 12/03/13 [phone]

## 2014-10-23 ENCOUNTER — Emergency Department (HOSPITAL_COMMUNITY)
Admission: EM | Admit: 2014-10-23 | Discharge: 2014-10-23 | Disposition: A | Discharge status: Home / Self Care | Payer: No Typology Code available for payment source

## 2014-11-01 ENCOUNTER — Encounter (HOSPITAL_COMMUNITY): Payer: Self-pay | Admitting: Emergency Medicine

## 2015-03-31 ENCOUNTER — Emergency Department (HOSPITAL_COMMUNITY)
Admission: EM | Admit: 2015-03-31 | Discharge: 2015-03-31 | Disposition: A | Discharge status: Home / Self Care | Payer: No Typology Code available for payment source | Attending: Emergency Medicine | Admitting: Emergency Medicine

## 2015-03-31 ENCOUNTER — Emergency Department (HOSPITAL_COMMUNITY): Payer: No Typology Code available for payment source

## 2015-03-31 ENCOUNTER — Encounter (HOSPITAL_COMMUNITY): Payer: Self-pay

## 2015-03-31 DIAGNOSIS — Z791 Long term (current) use of non-steroidal anti-inflammatories (NSAID): Secondary | ICD-10-CM | POA: Insufficient documentation

## 2015-03-31 DIAGNOSIS — Z8719 Personal history of other diseases of the digestive system: Secondary | ICD-10-CM | POA: Insufficient documentation

## 2015-03-31 DIAGNOSIS — R0789 Other chest pain: Secondary | ICD-10-CM | POA: Insufficient documentation

## 2015-03-31 DIAGNOSIS — I10 Essential (primary) hypertension: Secondary | ICD-10-CM | POA: Insufficient documentation

## 2015-03-31 DIAGNOSIS — Z79899 Other long term (current) drug therapy: Secondary | ICD-10-CM | POA: Insufficient documentation

## 2015-03-31 DIAGNOSIS — R05 Cough: Secondary | ICD-10-CM

## 2015-03-31 DIAGNOSIS — R509 Fever, unspecified: Secondary | ICD-10-CM | POA: Insufficient documentation

## 2015-03-31 DIAGNOSIS — R059 Cough, unspecified: Secondary | ICD-10-CM

## 2015-03-31 MED ORDER — IBUPROFEN 200 MG PO TABS
600.0000 mg | ORAL_TABLET | Freq: Once | ORAL | Status: AC
  Administered 2015-03-31: 600 mg via ORAL
  Filled 2015-03-31: qty 3

## 2015-03-31 MED ORDER — LORAZEPAM 1 MG PO TABS
1.0000 mg | ORAL_TABLET | Freq: Once | ORAL | Status: AC
  Administered 2015-03-31: 1 mg via ORAL
  Filled 2015-03-31: qty 1

## 2015-03-31 MED ORDER — TRAMADOL HCL 50 MG PO TABS: 50.0000 mg | ORAL_TABLET | Freq: Four times a day (QID) | ORAL | Status: DC | PRN

## 2015-03-31 MED ORDER — OXYCODONE-ACETAMINOPHEN 5-325 MG PO TABS
1.0000 | ORAL_TABLET | Freq: Once | ORAL | Status: AC
  Administered 2015-03-31: 1 via ORAL
  Filled 2015-03-31: qty 1

## 2015-03-31 NOTE — ED Notes (Signed)
Pt c/o chest congestion, cough, and generalized body aches x 2 days.  Pain score 9/10.  Pt reports taking OTC medications which helped w/ congestion.  Pt reports she has been around others w/ same symptoms that were diagnosed w/ the flu.  Congested cough noted.

## 2015-04-03 NOTE — ED Provider Notes (Signed)
CSN: [id number]     Arrival date & time 03/31/15  5732 History   First MD Initiated Contact with Patient 03/31/15 [phone]     Chief Complaint  Patient presents with  . Chest Congestion   . Cough  . Generalized Body Aches     (Consider location/radiation/quality/duration/timing/severity/associated sxs/prior Treatment) HPI   54yF with her cough, facial congestion and body aches. Symptom onset about 2 weeks ago. Relatively constant. Occasionally productive for sputum. Hours subjective fever. Feels tired. Has been taking over-the-counter cold medications with no significant relief. Unusual leg pain or swelling. Numerous sick contacts with respiratory symptoms recently.  Past Medical History  Diagnosis Date  . Hypertension   . GERD (gastroesophageal reflux disease)    Past Surgical History  Procedure Laterality Date  . Abdominal hysterectomy  2001    FIBROIDS  . Cesarean section  1990   Family History  Problem Relation Age of Onset  . Hypertension Mother    History  Substance Use Topics  . Smoking status: Never Smoker   . Smokeless tobacco: Never Used  . Alcohol Use: No   OB History    Gravida Para Term Preterm AB TAB SAB Ectopic Multiple Living   4 4 4       4      Review of Systems  All systems reviewed and negative, other than as noted in HPI.   Allergies  Review of patient's allergies indicates no known allergies.  Home Medications   Prior to Admission medications   Medication Sig Start Date End Date Taking? Authorizing Provider  acetaminophen (TYLENOL) 500 MG tablet Take 1,000-3,000 mg by mouth every 6 (six) hours as needed for moderate pain or headache.    Yes Historical Provider, MD  diclofenac (VOLTAREN) 75 MG EC tablet Take 75 mg by mouth 2 (two) times daily as needed for mild pain or moderate pain.   Yes Historical Provider, MD  guaiFENesin (MUCINEX) 600 MG 12 hr tablet Take 600 mg by mouth 2 (two) times daily as needed for cough.   Yes Historical Provider, MD   guaiFENesin (ROBITUSSIN) 100 MG/5ML liquid Take 200 mg by mouth 3 (three) times daily as needed for cough.   Yes Historical Provider, MD  hydrochlorothiazide (HYDRODIURIL) 25 MG tablet Take 25 mg by mouth daily.   Yes Historical Provider, MD  oxyCODONE-acetaminophen (PERCOCET/ROXICET) 5-325 MG per tablet Take 2 tablets by mouth every 4 (four) hours as needed for severe pain. Patient not taking: Reported on 03/31/2015 12/03/13   Lacretia Leigh, MD  predniSONE (DELTASONE) 10 MG tablet Take 2 tablets (20 mg total) by mouth daily. Patient not taking: Reported on 03/31/2015 12/03/13   Lacretia Leigh, MD  traMADol (ULTRAM) 50 MG tablet Take 1 tablet (50 mg total) by mouth every 6 (six) hours as needed. 03/31/15   Virgel Manifold, MD   BP 129/74 mmHg  Pulse 81  Temp(Src) 99.1 F (37.3 C) (Oral)  Resp 18  SpO2 96%  LMP 03/21/2000 Physical Exam  Constitutional: She appears well-developed and well-nourished. No distress.  HENT:  Head: Normocephalic and atraumatic.  Eyes: Conjunctivae are normal. Right eye exhibits no discharge. Left eye exhibits no discharge.  Neck: Neck supple.  Cardiovascular: Normal rate, regular rhythm and normal heart sounds.  Exam reveals no gallop and no friction rub.   No murmur heard. Pulmonary/Chest: Effort normal and breath sounds normal. No respiratory distress.  Abdominal: Soft. She exhibits no distension. There is no tenderness.  Musculoskeletal: She exhibits no edema or tenderness.  Lower extremities symmetric as compared to each other. No calf tenderness. Negative Homan's. No palpable cords.   Neurological: She is alert.  Skin: Skin is warm and dry.  Psychiatric: She has a normal mood and affect. Her behavior is normal. Thought content normal.  Nursing note and vitals reviewed.   ED Course  Procedures (including critical care time) Labs Review Labs Reviewed - No data to display  Imaging Review No results found.   Dg Chest 2 View (if Patient Has Fever And/or  Copd)  03/31/2015   CLINICAL DATA:  Cough for 2 days, mid chest pain  EXAM: CHEST  2 VIEW  COMPARISON:  02/24/2004  FINDINGS: Cardiomediastinal silhouette is stable. No acute infiltrate or pleural effusion. No pulmonary edema. Mild degenerative changes thoracic spine.  IMPRESSION: No active cardiopulmonary disease.   Electronically Signed   By: Lahoma Crocker M.D.   On: 03/31/2015 08:47    EKG Interpretation None      MDM   Final diagnoses:  Cough   54yF with what I suspect is a viral illness. No increased WOB. CXR clear. Nontoxic. It has been determined that no acute conditions requiring further emergency intervention are present at this time. The patient has been advised of the diagnosis and plan. I reviewed any labs and imaging including any potential incidental findings. We have discussed signs and symptoms that warrant return to the ED and they are listed in the discharge instructions.      Virgel Manifold, MD 04/03/15 1054

## 2015-05-17 ENCOUNTER — Encounter: Payer: Self-pay | Admitting: Internal Medicine

## 2015-06-25 ENCOUNTER — Emergency Department (HOSPITAL_COMMUNITY): Payer: No Typology Code available for payment source

## 2015-06-25 ENCOUNTER — Emergency Department (HOSPITAL_COMMUNITY)
Admission: EM | Admit: 2015-06-25 | Discharge: 2015-06-25 | Disposition: A | Discharge status: Home / Self Care | Payer: No Typology Code available for payment source | Attending: Emergency Medicine | Admitting: Emergency Medicine

## 2015-06-25 ENCOUNTER — Encounter (HOSPITAL_COMMUNITY): Payer: Self-pay | Admitting: Emergency Medicine

## 2015-06-25 DIAGNOSIS — Y9389 Activity, other specified: Secondary | ICD-10-CM | POA: Insufficient documentation

## 2015-06-25 DIAGNOSIS — Z79899 Other long term (current) drug therapy: Secondary | ICD-10-CM | POA: Insufficient documentation

## 2015-06-25 DIAGNOSIS — I10 Essential (primary) hypertension: Secondary | ICD-10-CM | POA: Insufficient documentation

## 2015-06-25 DIAGNOSIS — S99921A Unspecified injury of right foot, initial encounter: Secondary | ICD-10-CM | POA: Insufficient documentation

## 2015-06-25 DIAGNOSIS — Y998 Other external cause status: Secondary | ICD-10-CM | POA: Insufficient documentation

## 2015-06-25 DIAGNOSIS — W1849XA Other slipping, tripping and stumbling without falling, initial encounter: Secondary | ICD-10-CM | POA: Insufficient documentation

## 2015-06-25 DIAGNOSIS — M25571 Pain in right ankle and joints of right foot: Secondary | ICD-10-CM

## 2015-06-25 DIAGNOSIS — Y92480 Sidewalk as the place of occurrence of the external cause: Secondary | ICD-10-CM | POA: Insufficient documentation

## 2015-06-25 DIAGNOSIS — Z8719 Personal history of other diseases of the digestive system: Secondary | ICD-10-CM | POA: Insufficient documentation

## 2015-06-25 NOTE — ED Provider Notes (Signed)
CSN: [id number]     Arrival date & time 06/25/15  7001 History   First MD Initiated Contact with Patient 06/25/15 [phone]     Chief Complaint  Patient presents with  . Foot Pain     (Consider location/radiation/quality/duration/timing/severity/associated sxs/prior Treatment) HPI  Patient was also concern of right foot pain. Pain began about one week ago after the patient slipped on the sidewalk, jamming her distal medial foot. Since that time she said pain with ambulation, minimal at rest. She has soaked the foot in salt baths, but otherwise taking no medication. She continues to have pain with ambulation. She denies other muscular skeletal pain or other complaints, including loss of sensation. She states that she was well prior to the accident, denies orthopedic history.   Past Medical History  Diagnosis Date  . Hypertension   . GERD (gastroesophageal reflux disease)    Past Surgical History  Procedure Laterality Date  . Abdominal hysterectomy  2001    FIBROIDS  . Cesarean section  1990   Family History  Problem Relation Age of Onset  . Hypertension Mother    History  Substance Use Topics  . Smoking status: Never Smoker   . Smokeless tobacco: Never Used  . Alcohol Use: No   OB History    Gravida Para Term Preterm AB TAB SAB Ectopic Multiple Living   4 4 4       4      Review of Systems  Constitutional: Negative for fever.  Respiratory: Negative for shortness of breath.   Cardiovascular: Negative for chest pain.  Musculoskeletal:       Negative aside from HPI  Skin:       Negative aside from HPI  Allergic/Immunologic: Negative for immunocompromised state.  Neurological: Negative for weakness.      Allergies  Review of patient's allergies indicates no known allergies.  Home Medications   Prior to Admission medications   Medication Sig Start Date End Date Taking? Authorizing Provider  acetaminophen (TYLENOL) 500 MG tablet Take 1,000-3,000 mg by mouth  every 6 (six) hours as needed for moderate pain or headache.     Historical Provider, MD  diclofenac (VOLTAREN) 75 MG EC tablet Take 75 mg by mouth 2 (two) times daily as needed for mild pain or moderate pain.    Historical Provider, MD  guaiFENesin (MUCINEX) 600 MG 12 hr tablet Take 600 mg by mouth 2 (two) times daily as needed for cough.    Historical Provider, MD  guaiFENesin (ROBITUSSIN) 100 MG/5ML liquid Take 200 mg by mouth 3 (three) times daily as needed for cough.    Historical Provider, MD  hydrochlorothiazide (HYDRODIURIL) 25 MG tablet Take 25 mg by mouth daily.    Historical Provider, MD  oxyCODONE-acetaminophen (PERCOCET/ROXICET) 5-325 MG per tablet Take 2 tablets by mouth every 4 (four) hours as needed for severe pain. Patient not taking: Reported on 03/31/2015 12/03/13   Lacretia Leigh, MD  predniSONE (DELTASONE) 10 MG tablet Take 2 tablets (20 mg total) by mouth daily. Patient not taking: Reported on 03/31/2015 12/03/13   Lacretia Leigh, MD  traMADol (ULTRAM) 50 MG tablet Take 1 tablet (50 mg total) by mouth every 6 (six) hours as needed. 03/31/15   Virgel Manifold, MD   BP 145/82 mmHg  Pulse 98  Temp(Src) 98.3 F (36.8 C) (Oral)  Resp 13  SpO2 97%  LMP 03/21/2000 Physical Exam  Constitutional: She is oriented to person, place, and time. She appears well-developed and well-nourished. No distress.  HENT:  Head: Normocephalic and atraumatic.  Eyes: Conjunctivae and EOM are normal.  Cardiovascular: Normal rate, regular rhythm and intact distal pulses.   Pulmonary/Chest: No stridor.  Musculoskeletal: She exhibits no edema.       Right ankle: Normal.       Feet:  Neurological: She is alert and oriented to person, place, and time. No cranial nerve deficit.  Skin: Skin is warm and dry.  Psychiatric: She has a normal mood and affect.  Nursing note and vitals reviewed.   ED Course  Procedures (including critical care time) Labs Review Labs Reviewed - No data to display  Imaging  Review Dg Foot Complete Right  06/25/2015   CLINICAL DATA:  Pain to the toes.  EXAM: RIGHT FOOT COMPLETE - 3+ VIEW  COMPARISON:  None.  FINDINGS: Mild diffuse osteopenia. There is no fracture or subluxation identified. Small plantar heel spur.  IMPRESSION: 1. No acute findings.   Electronically Signed   By: Kerby Moors M.D.   On: 06/25/2015 09:35   I reviewed the x-ray, agree with the interpretation.  MDM   Final diagnoses:  Foot joint pain, right    Patient presents with likely musculoskeletal injury, no evidence for fracture. Patient is neurovascularly intact, hemodynamically stable, received immobilization device, analgesia, cryotherapy, was discharged in stable condition.  Carmin Muskrat, MD 06/25/15 1007

## 2015-06-25 NOTE — Discharge Instructions (Signed)
As discussed, your evaluation today has been largely reassuring.  But, it is important that you monitor your condition carefully, and do not hesitate to return to the ED if you develop new, or concerning changes in your condition.  Your pain is likely due to sprain, or strain of the ligaments on the foot.  Please use the provided orthopedic shoe for the next week.  Please use ice packs, 3 times daily for the next 5 days.  It is also important that you use ibuprofen, 600 mg, 3 times daily for the next 3 days.  Please follow-up with our orthopedist for appropriate ongoing care, if your symptoms persist beyond 3-5 days.

## 2015-06-25 NOTE — ED Notes (Signed)
Patient transported to X-ray 

## 2015-06-25 NOTE — ED Notes (Signed)
Pt reports her foot slipped off the sidewalk on Monday injuring R 3 lateral toes. Pt reports she has been unable to put weight on toes. Has been limping for the past few days.

## 2015-11-15 ENCOUNTER — Ambulatory Visit (INDEPENDENT_AMBULATORY_CARE_PROVIDER_SITE_OTHER): Payer: Self-pay | Admitting: Internal Medicine

## 2015-11-15 VITALS — BP 159/86 | HR 89 | Temp 98.0°F | Ht 61.0 in | Wt 234.5 lb

## 2015-11-15 DIAGNOSIS — R7303 Prediabetes: Secondary | ICD-10-CM

## 2015-11-15 DIAGNOSIS — I10 Essential (primary) hypertension: Secondary | ICD-10-CM

## 2015-11-15 DIAGNOSIS — K449 Diaphragmatic hernia without obstruction or gangrene: Secondary | ICD-10-CM

## 2015-11-15 DIAGNOSIS — R635 Abnormal weight gain: Secondary | ICD-10-CM

## 2015-11-15 DIAGNOSIS — K222 Esophageal obstruction: Secondary | ICD-10-CM

## 2015-11-15 DIAGNOSIS — Z0000 Encounter for general adult medical examination without abnormal findings: Secondary | ICD-10-CM

## 2015-11-15 DIAGNOSIS — K219 Gastro-esophageal reflux disease without esophagitis: Secondary | ICD-10-CM

## 2015-11-15 LAB — POCT GLYCOSYLATED HEMOGLOBIN (HGB A1C): HEMOGLOBIN A1C: 5.9

## 2015-11-15 LAB — GLUCOSE, CAPILLARY: GLUCOSE-CAPILLARY: 86 mg/dL (ref 65–99)

## 2015-11-15 MED ORDER — OMEPRAZOLE 20 MG PO CPDR: 20.0000 mg | DELAYED_RELEASE_CAPSULE | Freq: Every day | ORAL | Status: DC

## 2015-11-15 NOTE — Patient Instructions (Addendum)
RETURN IN ONE MONTH FOR BLOOD PRESSURE RECHECK.   TAKE HCTZ 25 MG DAILY AND PRILOSEC (OMEPRAZOLE) 20 MG DAILY FOR BLOOD PRESSURE AND YOUR ACID REFLUX.   Calorie Counting for Weight Loss Calories are energy you get from the things you eat and drink. Your body uses this energy to keep you going throughout the day. The number of calories you eat affects your weight. When you eat more calories than your body needs, your body stores the extra calories as fat. When you eat fewer calories than your body needs, your body burns fat to get the energy it needs. Calorie counting means keeping track of how many calories you eat and drink each day. If you make sure to eat fewer calories than your body needs, you should lose weight. In order for calorie counting to work, you will need to eat the number of calories that are right for you in a day to lose a healthy amount of weight per week. A healthy amount of weight to lose per week is usually 1-2 lb (0.5-0.9 kg). A dietitian can determine how many calories you need in a day and give you suggestions on how to reach your calorie goal.  WHAT IS MY MY PLAN? My goal is to have ____ calories per day.  If I have this many calories per day, I should lose around __________ pounds per week. WHAT DO I NEED TO KNOW ABOUT CALORIE COUNTING? In order to meet your daily calorie goal, you will need to:  Find out how many calories are in each food you would like to eat. Try to do this before you eat.  Decide how much of the food you can eat.  Write down what you ate and how many calories it had. Doing this is called keeping a food log. WHERE DO I FIND CALORIE INFORMATION? The number of calories in a food can be found on a Nutrition Facts label. Note that all the information on a label is based on a specific serving of the food. If a food does not have a Nutrition Facts label, try to look up the calories online or ask your dietitian for help. HOW DO I DECIDE HOW MUCH TO  EAT? To decide how much of the food you can eat, you will need to consider both the number of calories in one serving and the size of one serving. This information can be found on the Nutrition Facts label. If a food does not have a Nutrition Facts label, look up the information online or ask your dietitian for help. Remember that calories are listed per serving. If you choose to have more than one serving of a food, you will have to multiply the calories per serving by the amount of servings you plan to eat. For example, the label on a package of bread might say that a serving size is 1 slice and that there are 90 calories in a serving. If you eat 1 slice, you will have eaten 90 calories. If you eat 2 slices, you will have eaten 180 calories. HOW DO I KEEP A FOOD LOG? After each meal, record the following information in your food log:  What you ate.  How much of it you ate.  How many calories it had.  Then, add up your calories. Keep your food log near you, such as in a small notebook in your pocket. Another option is to use a mobile app or website. Some programs will calculate calories for you  and show you how many calories you have left each time you add an item to the log. WHAT ARE SOME CALORIE COUNTING TIPS?  Use your calories on foods and drinks that will fill you up and not leave you hungry. Some examples of this include foods like nuts and nut butters, vegetables, lean proteins, and high-fiber foods (more than 5 g fiber per serving).  Eat nutritious foods and avoid empty calories. Empty calories are calories you get from foods or beverages that do not have many nutrients, such as candy and soda. It is better to have a nutritious high-calorie food (such as an avocado) than a food with few nutrients (such as a bag of chips).  Know how many calories are in the foods you eat most often. This way, you do not have to look up how many calories they have each time you eat them.  Look out for  foods that may seem like low-calorie foods but are really high-calorie foods, such as baked goods, soda, and fat-free candy.  Pay attention to calories in drinks. Drinks such as sodas, specialty coffee drinks, alcohol, and juices have a lot of calories yet do not fill you up. Choose low-calorie drinks like water and diet drinks.  Focus your calorie counting efforts on higher calorie items. Logging the calories in a garden salad that contains only vegetables is less important than calculating the calories in a milk shake.  Find a way of tracking calories that works for you. Get creative. Most people who are successful find ways to keep track of how much they eat in a day, even if they do not count every calorie. WHAT ARE SOME PORTION CONTROL TIPS?  Know how many calories are in a serving. This will help you know how many servings of a certain food you can have.  Use a measuring cup to measure serving sizes. This is helpful when you start out. With time, you will be able to estimate serving sizes for some foods.  Take some time to put servings of different foods on your favorite plates, bowls, and cups so you know what a serving looks like.  Try not to eat straight from a bag or box. Doing this can lead to overeating. Put the amount you would like to eat in a cup or on a plate to make sure you are eating the right portion.  Use smaller plates, glasses, and bowls to prevent overeating. This is a quick and easy way to practice portion control. If your plate is smaller, less food can fit on it.  Try not to multitask while eating, such as watching TV or using your computer. If it is time to eat, sit down at a table and enjoy your food. Doing this will help you to start recognizing when you are full. It will also make you more aware of what and how much you are eating. HOW CAN I CALORIE COUNT WHEN EATING OUT?  Ask for smaller portion sizes or child-sized portions.  Consider sharing an entree and  sides instead of getting your own entree.  If you get your own entree, eat only half. Ask for a box at the beginning of your meal and put the rest of your entree in it so you are not tempted to eat it.  Look for the calories on the menu. If calories are listed, choose the lower calorie options.  Choose dishes that include vegetables, fruits, whole grains, low-fat dairy products, and lean protein. Focusing on  smart food choices from each of the 5 food groups can help you stay on track at restaurants.  Choose items that are boiled, broiled, grilled, or steamed.  Choose water, milk, unsweetened iced tea, or other drinks without added sugars. If you want an alcoholic beverage, choose a lower calorie option. For example, a regular margarita can have up to 700 calories and a glass of wine has around 150.  Stay away from items that are buttered, battered, fried, or served with cream sauce. Items labeled "crispy" are usually fried, unless stated otherwise.  Ask for dressings, sauces, and syrups on the side. These are usually very high in calories, so do not eat much of them.  Watch out for salads. Many people think salads are a healthy option, but this is often not the case. Many salads come with bacon, fried chicken, lots of cheese, fried chips, and dressing. All of these items have a lot of calories. If you want a salad, choose a garden salad and ask for grilled meats or steak. Ask for the dressing on the side, or ask for olive oil and vinegar or lemon to use as dressing.  Estimate how many servings of a food you are given. For example, a serving of cooked rice is  cup or about the size of half a tennis ball or one cupcake wrapper. Knowing serving sizes will help you be aware of how much food you are eating at restaurants. The list below tells you how big or small some common portion sizes are based on everyday objects.  1 oz--4 stacked dice.  3 oz--1 deck of cards.  1 tsp--1 dice.  1 Tbsp-- a  Ping-Pong ball.  2 Tbsp--1 Ping-Pong ball.   cup--1 tennis ball or 1 cupcake wrapper.  1 cup--1 baseball.   This information is not intended to replace advice given to you by your health care provider. Make sure you discuss any questions you have with your health care provider.   Document Released: 12/17/2005 Document Revised: 01/07/2015 Document Reviewed: 10/22/2013 Elsevier Interactive Patient Education [employer].

## 2015-11-15 NOTE — Progress Notes (Signed)
Subjective:    Patient ID: Anita Hughes, female    DOB: November 24, 1960, 55 y.o.   MRN: [id number]  HPI Anita Hughes is a 55 y.o. female with PMHx of HTN, GERD, esophageal stricture, pre-diabetes, hiatal hernia, and arthitis who presents to the clinic to establish care and for HTN. Please see A&P for the status of the patient's chronic medical problems.   Past Medical History  Diagnosis Date  . Hypertension   . GERD (gastroesophageal reflux disease)     Outpatient Encounter Prescriptions as of 11/15/2015  Medication Sig  . acetaminophen (TYLENOL) 500 MG tablet Take 1,000-3,000 mg by mouth every 6 (six) hours as needed for moderate pain or headache.   . diclofenac (VOLTAREN) 75 MG EC tablet Take 75 mg by mouth 2 (two) times daily as needed for mild pain or moderate pain.  Marland Kitchen guaiFENesin (MUCINEX) 600 MG 12 hr tablet Take 600 mg by mouth 2 (two) times daily as needed for cough.  Marland Kitchen guaiFENesin (ROBITUSSIN) 100 MG/5ML liquid Take 200 mg by mouth 3 (three) times daily as needed for cough.  . hydrochlorothiazide (HYDRODIURIL) 25 MG tablet Take 25 mg by mouth daily.  Marland Kitchen oxyCODONE-acetaminophen (PERCOCET/ROXICET) 5-325 MG per tablet Take 2 tablets by mouth every 4 (four) hours as needed for severe pain. (Patient not taking: Reported on 03/31/2015)  . predniSONE (DELTASONE) 10 MG tablet Take 2 tablets (20 mg total) by mouth daily. (Patient not taking: Reported on 03/31/2015)  . traMADol (ULTRAM) 50 MG tablet Take 1 tablet (50 mg total) by mouth every 6 (six) hours as needed.   No facility-administered encounter medications on file as of 11/15/2015.    Family History  Problem Relation Age of Onset  . Hypertension Mother     Social History   Social History  . Marital Status: Married    Spouse Name: N/A  . Number of Children: N/A  . Years of Education: N/A   Occupational History  . Not on file.   Social History Main Topics  . Smoking status: Never Smoker   . Smokeless tobacco:  Never Used  . Alcohol Use: No  . Drug Use: No  . Sexual Activity: Yes   Other Topics Concern  . Not on file   Social History Narrative   Review of Systems General: Admits to weight gain. Denies fever, chills, fatigue.  Respiratory: Denies SOB, DOE.   Cardiovascular: Denies chest pain and palpitations.  Gastrointestinal: Admits to reflux. Denies diarrhea, constipation, blood in stool.  Endocrine: Denies polyuria, and polydipsia. Skin: Denies pallor, rash and wounds.  Neurological: Denies dizziness, headaches, weakness, lightheadedness     Objective:   Physical Exam Filed Vitals:   11/15/15 1533  BP: 165/84  Pulse: 105  Temp: 98 F (36.7 C)  TempSrc: Oral  Height: 5\' 1"  (1.549 m)  Weight: 234 lb 8 oz (106.369 kg)  SpO2: 99%   General: Vital signs reviewed.  Patient is overweight, in no acute distress and cooperative with exam.  HEENT: Normocephalic and atraumatic. EOMI, conjunctivae normal, no scleral icterus. Supple, trachea midline, normal ROM. Normal tympanic membranes. Normal nasal turbinates. Normal posterior oropharynx.  Cardiovascular: RRR, S1 normal, S2 normal, no murmurs, gallops, or rubs. Pulmonary/Chest: Clear to auscultation bilaterally, no wheezes, rales, or rhonchi. Abdominal: Soft, non-tender, non-distended, BS +  Extremities: No lower extremity edema bilaterally, pulses symmetric and intact bilaterally. Skin: Warm, dry and intact. No rashes or erythema. Psychiatric: Normal mood and affect. speech and behavior is normal. Cognition and memory  are normal.      Assessment & Plan:   Please see problem based assessment and plan.

## 2015-11-16 LAB — BMP8+ANION GAP
Anion Gap: 18 mmol/L (ref 10.0–18.0)
BUN / CREAT RATIO: 13 (ref 9–23)
BUN: 11 mg/dL (ref 6–24)
CO2: 25 mmol/L (ref 18–29)
Calcium: 9.5 mg/dL (ref 8.7–10.2)
Chloride: 99 mmol/L (ref 97–106)
Creatinine, Ser: 0.83 mg/dL (ref 0.57–1.00)
GFR calc Af Amer: 92 mL/min/{1.73_m2} (ref >59)
GFR calc non Af Amer: 80 mL/min/{1.73_m2} (ref >59)
Glucose: 88 mg/dL (ref 65–99)
Potassium: 3.7 mmol/L (ref 3.5–5.2)
Sodium: 142 mmol/L (ref 136–144)

## 2015-11-16 LAB — TSH: TSH: 1.66 u[IU]/mL (ref 0.450–4.500)

## 2015-11-16 NOTE — Assessment & Plan Note (Signed)
Patient has a history of hiatal hernia seen on EGD. It was recommended that she be on PPI indefinitely due to GERD and esophageal stricture. Patient states she has been using Prilosec OTC as needed for reflux. She experiences daily reflux symptoms.  Plan: -Omeprazole 20 mg daily

## 2015-11-16 NOTE — Assessment & Plan Note (Signed)
Patient has a history of GERD, esophageal stricture (dilated in 2005), and hiatal hernia seen on EGD. It was recommended that she be on PPI indefinitely. Patient states she has been using Prilosec OTC as needed for reflux. She experiences daily reflux symptoms, but denies any dysphagia.  Plan: -Omeprazole 20 mg daily

## 2015-11-16 NOTE — Assessment & Plan Note (Signed)
BP Readings from Last 3 Encounters:  11/15/15 159/86  06/25/15 145/71  03/31/15 129/74    Lab Results  Component Value Date   NA 142 11/15/2015   K 3.7 11/15/2015   CREATININE 0.83 11/15/2015    Assessment: Blood pressure control:  Uncontrolled Progress toward BP goal:   Deteriorated Comments: Patient was prescribed HCTZ 25 mg daily in the past, but admits to being non-compliant. She had not taken it in a while, but has been taking it for the last 3-4 days prior to her visit.   Plan: Medications:  continue current medications Other plans: BMET was normal- BUN/Cr normal, potassium within normal limits. At follow up visit in 4 weeks, if BP still elevated, consider adding ACEI (patient is pre-diabetic) or amlodipine as next line agent.

## 2015-11-16 NOTE — Assessment & Plan Note (Signed)
Patient has been told in the past that she is a pre-diabetic. Repeat HgbA1c today was 5.9. Recommended diet and exercise.  Plan: -Diet and exercise -Monitor HgbA1c yearly for diabetes progression

## 2015-11-16 NOTE — Assessment & Plan Note (Signed)
Patient has a history of esophageal stricture (dilated in 2005). It was recommended that Anita Hughes be on PPI indefinitely. Patient states Anita Hughes has been using Prilosec OTC as needed for reflux. Anita Hughes denies any dysphagia.  Plan: -Omeprazole 20 mg daily

## 2015-11-16 NOTE — Assessment & Plan Note (Signed)
Colonoscopy: Normal in 11/2010; repeat 2021  Mammogram: Normal 2011; needs repeat once established with orange card  Pap Smear: s/p complete hysterectomy with cervix removal. Does not need future pap smears.   Hep C: Refused  HIV: Refused  Flu Vaccine: Refused

## 2015-11-16 NOTE — Progress Notes (Signed)
Internal Medicine Clinic Attending  Case discussed with Dr. Richardson at the time of the visit.  We reviewed the resident's history and exam and pertinent patient test results.  I agree with the assessment, diagnosis, and plan of care documented in the resident's note. 

## 2015-11-16 NOTE — Assessment & Plan Note (Signed)
Patient admits to weight gain in the last several years likely secondary to losing her job, a more sedentary lifestyle and excess calories. We checked her TSH which was normal. Patient asks for a safe OTC pill for weight loss.   Plan: -Diet and exercise -Gave information on calorie counting -Have patient meet with Butch Penny for Nutrition Education

## 2015-12-07 ENCOUNTER — Ambulatory Visit: Payer: Self-pay

## 2015-12-19 ENCOUNTER — Other Ambulatory Visit: Payer: Self-pay | Admitting: Internal Medicine

## 2015-12-19 NOTE — Telephone Encounter (Signed)
[facility] called [facility] and [facility] will transfer script from [facility]

## 2015-12-19 NOTE — Telephone Encounter (Signed)
Pt requesting omeprazole to be filled @ [facility].

## 2016-03-23 ENCOUNTER — Ambulatory Visit: Payer: Self-pay | Admitting: Internal Medicine

## 2016-03-23 ENCOUNTER — Telehealth: Payer: Self-pay | Admitting: Internal Medicine

## 2016-03-23 NOTE — Telephone Encounter (Signed)
Pt calls and is stating that she doesn't have medicine because "you all told the pharmacy i have never been there and dont have any meds from any of your doctors", reviewed chart, spoke w/ pharm in dec and they transferred med from [facility], pt has not requested refills Informed pt she needed a f/u visit and she stated she must work all the time but she is off today, appt given for 1545 dr Genene Churn, "oh i can get in today", states she will be here

## 2016-03-23 NOTE — Telephone Encounter (Signed)
Requesting the nurse to call back.

## 2016-03-25 ENCOUNTER — Other Ambulatory Visit: Payer: Self-pay | Admitting: Internal Medicine

## 2016-03-25 DIAGNOSIS — K219 Gastro-esophageal reflux disease without esophagitis: Secondary | ICD-10-CM

## 2016-03-25 MED ORDER — OMEPRAZOLE 20 MG PO CPDR: 20.0000 mg | DELAYED_RELEASE_CAPSULE | Freq: Every day | ORAL | Status: DC

## 2016-03-25 MED ORDER — HYDROCHLOROTHIAZIDE 25 MG PO TABS: 25.0000 mg | ORAL_TABLET | Freq: Every day | ORAL | Status: DC

## 2016-03-25 NOTE — Telephone Encounter (Signed)
It appears patient did not come in for her appoint on 3/24. I have given a short supply of refills for her HCTZ and Omeprazole, but I agree patient needs to come in for a follow up appointment in the next 3 months.

## 2016-03-26 ENCOUNTER — Encounter: Payer: Self-pay | Admitting: Internal Medicine

## 2016-06-20 ENCOUNTER — Ambulatory Visit (INDEPENDENT_AMBULATORY_CARE_PROVIDER_SITE_OTHER): Payer: Self-pay | Admitting: Internal Medicine

## 2016-06-20 ENCOUNTER — Encounter: Payer: Self-pay | Admitting: Internal Medicine

## 2016-06-20 VITALS — BP 150/79 | HR 80 | Temp 98.1°F | Ht 61.0 in | Wt 236.1 lb

## 2016-06-20 DIAGNOSIS — R7303 Prediabetes: Secondary | ICD-10-CM

## 2016-06-20 DIAGNOSIS — K219 Gastro-esophageal reflux disease without esophagitis: Secondary | ICD-10-CM

## 2016-06-20 DIAGNOSIS — Z0000 Encounter for general adult medical examination without abnormal findings: Secondary | ICD-10-CM

## 2016-06-20 DIAGNOSIS — I10 Essential (primary) hypertension: Secondary | ICD-10-CM

## 2016-06-20 DIAGNOSIS — K449 Diaphragmatic hernia without obstruction or gangrene: Secondary | ICD-10-CM

## 2016-06-20 MED ORDER — PANTOPRAZOLE SODIUM 40 MG PO TBEC
40.0000 mg | DELAYED_RELEASE_TABLET | Freq: Every day | ORAL | Status: DC
Start: 2016-06-20 — End: 2016-08-16

## 2016-06-20 NOTE — Assessment & Plan Note (Signed)
Patient admits to recurrent reflux associated with certain foods such as spaghetti. Patient knows which foods worsen her GERD, but will still eat them from time to time. She feels that omeprazole is not working well. She denies any dysphagia, vomiting, or unexpected weight loss.   Plan: -Trial of Protonix 40 mg daily -If persistent, will switch to an H2 blocker

## 2016-06-20 NOTE — Progress Notes (Signed)
   Subjective:    Patient ID: Anita Hughes, female    DOB: 10/10/1960, 56 y.o.   MRN: [id number]  HPI Anita Hughes is a 56 y.o. female with PMHx of HTN and GERD who presents to the clinic for follow up for HTN. Please see A&P for the status of the patient's chronic medical problems.   Past Medical History  Diagnosis Date  . Hypertension   . GERD (gastroesophageal reflux disease)     Outpatient Encounter Prescriptions as of 06/20/2016  Medication Sig  . hydrochlorothiazide (HYDRODIURIL) 25 MG tablet Take 1 tablet (25 mg total) by mouth daily.  . pantoprazole (PROTONIX) 40 MG tablet Take 1 tablet (40 mg total) by mouth daily.  . [DISCONTINUED] omeprazole (PRILOSEC) 20 MG capsule Take 1 capsule (20 mg total) by mouth daily.   No facility-administered encounter medications on file as of 06/20/2016.    Family History  Problem Relation Age of Onset  . Hypertension Mother     Social History   Social History  . Marital Status: Married    Spouse Name: N/A  . Number of Children: N/A  . Years of Education: N/A   Occupational History  . Not on file.   Social History Main Topics  . Smoking status: Never Smoker   . Smokeless tobacco: Never Used  . Alcohol Use: No  . Drug Use: No  . Sexual Activity: Yes   Other Topics Concern  . Not on file   Social History Narrative   Review of Systems General: Denies fever, chills, fatigue.  Respiratory: Denies SOB, cough, DOE.   Cardiovascular: Denies chest pain and palpitations.  Gastrointestinal: Admits to GERD. Denies nausea, vomiting, abdominal pain, diarrhea, constipation Endocrine: Denies polyuria, and polydipsia. Neurological: Admits to occasional lightheadedness when standing. Denies headaches, weakness, numbness, and syncope,    Objective:   Physical Exam Filed Vitals:   06/20/16 1601  BP: 150/79  Pulse: 80  Temp: 98.1 F (36.7 C)  TempSrc: Oral  Height: 5\' 1"  (1.549 m)  Weight: 236 lb 1.6 oz (107.094 kg)    SpO2: 100%   General: Vital signs reviewed.  Patient is well-developed and well-nourished, in no acute distress and cooperative with exam.  Neck: Supple, trachea midline, no carotid bruit present.  Cardiovascular: RRR, S1 normal, S2 normal, no murmurs, gallops, or rubs. Pulmonary/Chest: Clear to auscultation bilaterally, no wheezes, rales, or rhonchi. Abdominal: Soft, non-tender, non-distended, BS + Extremities: Trace pitting lower extremity edema bilaterally to ankles, pedal pulses 2+ Skin: Warm, dry and intact.  Psychiatric: Normal mood and affect. speech and behavior is normal. Cognition and memory are normal.     Assessment & Plan:    Please see problem based assessment and plan.

## 2016-06-20 NOTE — Assessment & Plan Note (Signed)
Patient has not finished applying for her [employer]. I have encouraged patient to do so to help with future medical expenses and tests.   Mammogram: Due, needs orange card Colonoscopy: Repeat due in 2021

## 2016-06-20 NOTE — Assessment & Plan Note (Signed)
BP Readings from Last 3 Encounters:  06/20/16 150/79  11/15/15 159/86  06/25/15 145/71    Lab Results  Component Value Date   NA 142 11/15/2015   K 3.7 11/15/2015   CREATININE 0.83 11/15/2015    Assessment: Blood pressure control:  above goal Progress toward BP goal:   stagnant Comments: Patient states she forgot to take her HCTZ today. When she checks it at [facility], it runs in the 140s.   Plan: Medications: Patient would like to continue current medications and work on weight loss and diet changes Other plans: Patient will return for a follow up and make sure she has taken her medication so that we can have an accurate BP. If still >140/90 at follow up, would add lisinopril 20 mg daily.

## 2016-06-20 NOTE — Patient Instructions (Addendum)
CONTINUE ALL MEDICATIONS AS PRESCRIBED. TAKE PANTOPRAZOLE ONCE A DAY FOR YOUR REFLUX. IF THIS MEDICATIONS IS TOO EXPENSIVE, PLEASE LET ME KNOW AND WE CAN TRY ANOTHER ONE.   RETURN IN ABOUT 4 TO 5 MONTHS FOR A RECHECK.   PLEASE CONTINUE TO WORK ON DIET, EXERCISE, AND WEIGHT LOSS.  BE SURE TO TAKE YOUR HYDROCHLOROTHIAZIDE EVERYDAY.

## 2016-06-20 NOTE — Assessment & Plan Note (Signed)
Recheck A1c in November 2017.

## 2016-07-02 NOTE — Progress Notes (Signed)
Medicine attending: Medical history, presenting problems, physical findings, and medications, reviewed with resident physician Dr Alexa Burns on the day of the patient visit and I concur with her evaluation and management plan. 

## 2016-08-16 ENCOUNTER — Other Ambulatory Visit: Payer: Self-pay | Admitting: Internal Medicine

## 2016-08-16 DIAGNOSIS — K219 Gastro-esophageal reflux disease without esophagitis: Secondary | ICD-10-CM

## 2016-08-16 DIAGNOSIS — K449 Diaphragmatic hernia without obstruction or gangrene: Principal | ICD-10-CM

## 2016-08-16 MED ORDER — HYDROCHLOROTHIAZIDE 25 MG PO TABS: 25.0000 mg | ORAL_TABLET | Freq: Every day | ORAL | 11 refills | Status: DC

## 2016-08-16 MED ORDER — PANTOPRAZOLE SODIUM 40 MG PO TBEC: 40.0000 mg | DELAYED_RELEASE_TABLET | Freq: Every day | ORAL | 11 refills | Status: DC

## 2016-10-22 ENCOUNTER — Other Ambulatory Visit: Payer: Self-pay | Admitting: Internal Medicine

## 2016-10-22 DIAGNOSIS — K449 Diaphragmatic hernia without obstruction or gangrene: Principal | ICD-10-CM

## 2016-10-22 DIAGNOSIS — K219 Gastro-esophageal reflux disease without esophagitis: Secondary | ICD-10-CM

## 2017-03-14 ENCOUNTER — Ambulatory Visit (INDEPENDENT_AMBULATORY_CARE_PROVIDER_SITE_OTHER): Payer: Self-pay | Admitting: Internal Medicine

## 2017-03-14 VITALS — BP 169/102 | HR 89 | Temp 98.0°F | Ht 60.0 in | Wt 241.5 lb

## 2017-03-14 DIAGNOSIS — R6884 Jaw pain: Secondary | ICD-10-CM

## 2017-03-14 DIAGNOSIS — J019 Acute sinusitis, unspecified: Secondary | ICD-10-CM

## 2017-03-14 DIAGNOSIS — R51 Headache: Secondary | ICD-10-CM

## 2017-03-14 DIAGNOSIS — J3489 Other specified disorders of nose and nasal sinuses: Secondary | ICD-10-CM

## 2017-03-14 DIAGNOSIS — H9393 Unspecified disorder of ear, bilateral: Secondary | ICD-10-CM

## 2017-03-14 DIAGNOSIS — J329 Chronic sinusitis, unspecified: Secondary | ICD-10-CM | POA: Insufficient documentation

## 2017-03-14 MED ORDER — FLUTICASONE PROPIONATE 50 MCG/ACT NA SUSP: 1.0000 | Freq: Every day | NASAL | 2 refills | Status: DC

## 2017-03-14 MED ORDER — CETIRIZINE HCL 10 MG PO TABS: 10.0000 mg | ORAL_TABLET | Freq: Every day | ORAL | 2 refills | Status: DC

## 2017-03-14 MED ORDER — OXYMETAZOLINE HCL 0.05 % NA SOLN: 1.0000 | Freq: Two times a day (BID) | NASAL | 0 refills | Status: DC

## 2017-03-14 NOTE — Assessment & Plan Note (Addendum)
Patient likely has acute sinuitis (likely viral) causing nasal congestion, sinus pressure, headache and some referred pain to the jaw and fullness to the ear. I discussed that abx will not be helpful.   - symptomatic tx with Afrin x3 days for congestion, flonase and zyrtec, neti pot, tylenol for pain.

## 2017-03-14 NOTE — Progress Notes (Signed)
Entered in error. Please ignore

## 2017-03-14 NOTE — Progress Notes (Signed)
   CC: not feeling well  HPI:  [name] is a 57 y.o. with pmh as listed below is here with acute complaint of sinus pressure, headache, ear itching, ears popping, and jaw pain. From the sinus pressure she is having a headache. No runny nose, no fevers, occasional coughing. No n/v, diarrhea, no vision or hearing changes. Mild sore throat when she coughs.   Has been taking mucinex sinus, cough lozenges.   She is upset that she recently found a temporary job and due to her illness her boss told her to not work until she is completely better. I offered her a work note but she refused. She started crying during the interview and got upset when I stated that this would like few days to get better regardless of what treatment we do. She left without getting AVS but asked me to send the medications to her pharmacy.   Past Medical History:  Diagnosis Date  . GERD (gastroesophageal reflux disease)   . Hypertension     Review of Systems:   Review of Systems  Constitutional: Negative for chills and fever.  Eyes: Negative for blurred vision.  Respiratory: Negative for sputum production and shortness of breath.   Cardiovascular: Negative for chest pain.  Gastrointestinal: Negative for heartburn, nausea and vomiting.  Genitourinary: Negative for dysuria.  Neurological: Negative for dizziness.     Physical Exam:  Vitals:   03/14/17 1533  BP: (!) 169/102  Pulse: 89  Temp: 98 F (36.7 C)  TempSrc: Oral  SpO2: 98%  Weight: 241 lb 8 oz (109.5 kg)  Height: 5' (1.524 m)   Physical Exam  Constitutional: She is oriented to person, place, and time. She appears well-developed and well-nourished. No distress.  HENT:  Mouth/Throat: No oropharyngeal exudate.  No exudates. TM are normal appearing bilaterally.  No tenderness over the sinuses. Has mild tenderness to TMJ bilaterally with jaw movement.   Neck:  No lymphadenopathies.   Respiratory: Effort normal and breath sounds normal.  No respiratory distress. She has no wheezes.  Musculoskeletal: Normal range of motion. She exhibits no edema.  Neurological: She is alert and oriented to person, place, and time.  Skin: She is not diaphoretic.    Assessment & Plan:   See Encounters Tab for problem based charting.  Patient discussed with Dr. Dareen Piano

## 2017-03-20 NOTE — Progress Notes (Signed)
Internal Medicine Clinic Attending  Case discussed with Dr. Ahmed at the time of the visit.  We reviewed the resident's history and exam and pertinent patient test results.  I agree with the assessment, diagnosis, and plan of care documented in the resident's note. 

## 2017-05-08 ENCOUNTER — Ambulatory Visit (INDEPENDENT_AMBULATORY_CARE_PROVIDER_SITE_OTHER): Payer: Self-pay | Admitting: Internal Medicine

## 2017-05-08 ENCOUNTER — Encounter: Payer: Self-pay | Admitting: Internal Medicine

## 2017-05-08 VITALS — BP 138/64 | HR 90 | Temp 98.0°F | Ht 61.0 in | Wt 245.6 lb

## 2017-05-08 DIAGNOSIS — Z6841 Body Mass Index (BMI) 40.0 and over, adult: Secondary | ICD-10-CM

## 2017-05-08 DIAGNOSIS — K219 Gastro-esophageal reflux disease without esophagitis: Secondary | ICD-10-CM

## 2017-05-08 DIAGNOSIS — K449 Diaphragmatic hernia without obstruction or gangrene: Secondary | ICD-10-CM

## 2017-05-08 DIAGNOSIS — E785 Hyperlipidemia, unspecified: Secondary | ICD-10-CM

## 2017-05-08 DIAGNOSIS — I10 Essential (primary) hypertension: Secondary | ICD-10-CM

## 2017-05-08 DIAGNOSIS — R7303 Prediabetes: Secondary | ICD-10-CM

## 2017-05-08 DIAGNOSIS — Z0000 Encounter for general adult medical examination without abnormal findings: Secondary | ICD-10-CM

## 2017-05-08 DIAGNOSIS — Z79899 Other long term (current) drug therapy: Secondary | ICD-10-CM

## 2017-05-08 DIAGNOSIS — E6609 Other obesity due to excess calories: Secondary | ICD-10-CM

## 2017-05-08 MED ORDER — ATORVASTATIN CALCIUM 20 MG PO TABS: 20.0000 mg | ORAL_TABLET | Freq: Every day | ORAL | 11 refills | Status: DC

## 2017-05-08 NOTE — Progress Notes (Signed)
    CC: Follow-up for hypertension  HPI: [name] is a 57 y.o. female with PMHx of hypertension, GERD, prediabetes who presents to the clinic for follow-up for hypertension.  Age and is eating and drinking well. She denies chest pain, shortness of breath.   Please see problem based assessment and plan for more information of patient's chronic medical conditions.   Past Medical History:  Diagnosis Date  . GERD (gastroesophageal reflux disease)   . Hypertension     Review of Systems: Please see pertinent ROS reviewed in HPI and problem based charting.   Physical Exam: Blood pressure 138/64, pulse 90, temperature 98 F (36.7 C), temperature source Oral, height 5\' 1"  (1.549 m), weight 245 lb 9.6 oz (111.4 kg), last menstrual period 03/21/2000, SpO2 100 %. General: Vital signs reviewed.  Patient is Obese, in no acute distress and cooperative with exam.  Cardiovascular: RRR,  no murmurs, gallops, or rubs. No JVD or carotid bruit present. No lower extremity edema bilaterally. Bilateral radial and pedal pulses are intact and symmetric bilaterally.  Pulmonary: Clear to auscultation bilaterally, no wheezes, rales, or rhonchi. No accessory muscle use. Gastrointestinal: Soft, non-tender, non-distended, BS +, obese  Skin: Warm, dry and intact. No rashes or erythema. Psychiatric: Normal mood and affect. speech and behavior is normal. Cognition and memory are normal.   Assessment & Plan:  See encounters tab for problem based medical decision making. Patient discussed with Dr. Dareen Piano

## 2017-05-08 NOTE — Assessment & Plan Note (Signed)
Patient is obese. We had a long discussion of strategies to improve lifestyle. Patient states her main issue is uncontrolled diet with desserts and snacking before bedtime. She had fast food today. She drinks plenty of water and will only occasionally drink a sugary drinks such as lemonade. She is not as active as she would like to be. She reports increased stress as both her son and daughter and her granddaughter now living with her. She was recently seen by the bariatric clinic in March 2018. Unfortunately, without insurance patient will not likely be eligible for weight loss surgery at this time. We discussed different strategies to improve her diet and cut back on sugary sweets.  Assessment: Morbid obesity  Plan: -Diet and exercise -Given ASCVD risk score of 8.9%, admitted atorvastatin 20 mg daily

## 2017-05-08 NOTE — Assessment & Plan Note (Signed)
Recent lipid panel showed total cholesterol 234, triglycerides 138, HDL 47, LDL 159. ASCVD risk score is 8.9%.  Assessment: Hyperlipidemia  Plan: -Start atorvastatin 20 mg daily

## 2017-05-08 NOTE — Patient Instructions (Addendum)
Please take atorvastatin 20 mg daily for cholesterol. Continue taking hydrochlorothiazide 25 mg daily. Please follow up in 3 months.   Calorie Counting for Weight Loss Calories are units of energy. Your body needs a certain amount of calories from food to keep you going throughout the day. When you eat more calories than your body needs, your body stores the extra calories as fat. When you eat fewer calories than your body needs, your body Diamante Rubin fat to get the energy it needs. Calorie counting means keeping track of how many calories you eat and drink each day. Calorie counting can be helpful if you need to lose weight. If you make sure to eat fewer calories than your body needs, you should lose weight. Ask your health care provider what a healthy weight is for you. For calorie counting to work, you will need to eat the right number of calories in a day in order to lose a healthy amount of weight per week. A dietitian can help you determine how many calories you need in a day and will give you suggestions on how to reach your calorie goal.  A healthy amount of weight to lose per week is usually 1-2 lb (0.5-0.9 kg). This usually means that your daily calorie intake should be reduced by 500-750 calories.  Eating 1,200 - 1,500 calories per day can help most women lose weight.  Eating 1,500 - 1,800 calories per day can help most men lose weight. What is my plan? My goal is to have __________ calories per day. If I have this many calories per day, I should lose around __________ pounds per week. What do I need to know about calorie counting? In order to meet your daily calorie goal, you will need to:  Find out how many calories are in each food you would like to eat. Try to do this before you eat.  Decide how much of the food you plan to eat.  Write down what you ate and how many calories it had. Doing this is called keeping a food log. To successfully lose weight, it is important to balance  calorie counting with a healthy lifestyle that includes regular activity. Aim for 150 minutes of moderate exercise (such as walking) or 75 minutes of vigorous exercise (such as running) each week. Where do I find calorie information?   The number of calories in a food can be found on a Nutrition Facts label. If a food does not have a Nutrition Facts label, try to look up the calories online or ask your dietitian for help. Remember that calories are listed per serving. If you choose to have more than one serving of a food, you will have to multiply the calories per serving by the amount of servings you plan to eat. For example, the label on a package of bread might say that a serving size is 1 slice and that there are 90 calories in a serving. If you eat 1 slice, you will have eaten 90 calories. If you eat 2 slices, you will have eaten 180 calories. How do I keep a food log? Immediately after each meal, record the following information in your food log:  What you ate. Don't forget to include toppings, sauces, and other extras on the food.  How much you ate. This can be measured in cups, ounces, or number of items.  How many calories each food and drink had.  The total number of calories in the meal. Keep your food  log near you, such as in a small notebook in your pocket, or use a mobile app or website. Some programs will calculate calories for you and show you how many calories you have left for the day to meet your goal. What are some calorie counting tips?  Use your calories on foods and drinks that will fill you up and not leave you hungry:  Some examples of foods that fill you up are nuts and nut butters, vegetables, lean proteins, and high-fiber foods like whole grains. High-fiber foods are foods with more than 5 g fiber per serving.  Drinks such as sodas, specialty coffee drinks, alcohol, and juices have a lot of calories, yet do not fill you up.  Eat nutritious foods and avoid empty  calories. Empty calories are calories you get from foods or beverages that do not have many vitamins or protein, such as candy, sweets, and soda. It is better to have a nutritious high-calorie food (such as an avocado) than a food with few nutrients (such as a bag of chips).  Know how many calories are in the foods you eat most often. This will help you calculate calorie counts faster.  Pay attention to calories in drinks. Low-calorie drinks include water and unsweetened drinks.  Pay attention to nutrition labels for "low fat" or "fat free" foods. These foods sometimes have the same amount of calories or more calories than the full fat versions. They also often have added sugar, starch, or salt, to make up for flavor that was removed with the fat.  Find a way of tracking calories that works for you. Get creative. Try different apps or programs if writing down calories does not work for you. What are some portion control tips?  Know how many calories are in a serving. This will help you know how many servings of a certain food you can have.  Use a measuring cup to measure serving sizes. You could also try weighing out portions on a kitchen scale. With time, you will be able to estimate serving sizes for some foods.  Take some time to put servings of different foods on your favorite plates, bowls, and cups so you know what a serving looks like.  Try not to eat straight from a bag or box. Doing this can lead to overeating. Put the amount you would like to eat in a cup or on a plate to make sure you are eating the right portion.  Use smaller plates, glasses, and bowls to prevent overeating.  Try not to multitask (for example, watch TV or use your computer) while eating. If it is time to eat, sit down at a table and enjoy your food. This will help you to know when you are full. It will also help you to be aware of what you are eating and how much you are eating. What are tips for following this  plan? Reading food labels   Check the calorie count compared to the serving size. The serving size may be smaller than what you are used to eating.  Check the source of the calories. Make sure the food you are eating is high in vitamins and protein and low in saturated and trans fats. Shopping   Read nutrition labels while you shop. This will help you make healthy decisions before you decide to purchase your food.  Make a grocery list and stick to it. Cooking   Try to cook your favorite foods in a healthier way. For example,  try baking instead of frying.  Use low-fat dairy products. Meal planning   Use more fruits and vegetables. Half of your plate should be fruits and vegetables.  Include lean proteins like poultry and fish. How do I count calories when eating out?  Ask for smaller portion sizes.  Consider sharing an entree and sides instead of getting your own entree.  If you get your own entree, eat only half. Ask for a box at the beginning of your meal and put the rest of your entree in it so you are not tempted to eat it.  If calories are listed on the menu, choose the lower calorie options.  Choose dishes that include vegetables, fruits, whole grains, low-fat dairy products, and lean protein.  Choose items that are boiled, broiled, grilled, or steamed. Stay away from items that are buttered, battered, fried, or served with cream sauce. Items labeled "crispy" are usually fried, unless stated otherwise.  Choose water, low-fat milk, unsweetened iced tea, or other drinks without added sugar. If you want an alcoholic beverage, choose a lower calorie option such as a glass of wine or light beer.  Ask for dressings, sauces, and syrups on the side. These are usually high in calories, so you should limit the amount you eat.  If you want a salad, choose a garden salad and ask for grilled meats. Avoid extra toppings like bacon, cheese, or fried items. Ask for the dressing on the  side, or ask for olive oil and vinegar or lemon to use as dressing.  Estimate how many servings of a food you are given. For example, a serving of cooked rice is  cup or about the size of half a baseball. Knowing serving sizes will help you be aware of how much food you are eating at restaurants. The list below tells you how big or small some common portion sizes are based on everyday objects:  1 oz-4 stacked dice.  3 oz-1 deck of cards.  1 tsp-1 die.  1 Tbsp- a ping-pong ball.  2 Tbsp-1 ping-pong ball.   cup- baseball.  1 cup-1 baseball. Summary  Calorie counting means keeping track of how many calories you eat and drink each day. If you eat fewer calories than your body needs, you should lose weight.  A healthy amount of weight to lose per week is usually 1-2 lb (0.5-0.9 kg). This usually means reducing your daily calorie intake by 500-750 calories.  The number of calories in a food can be found on a Nutrition Facts label. If a food does not have a Nutrition Facts label, try to look up the calories online or ask your dietitian for help.  Use your calories on foods and drinks that will fill you up, and not on foods and drinks that will leave you hungry.  Use smaller plates, glasses, and bowls to prevent overeating. This information is not intended to replace advice given to you by your health care provider. Make sure you discuss any questions you have with your health care provider. Document Released: 12/17/2005 Document Revised: 11/16/2016 Document Reviewed: 11/16/2016 Elsevier Interactive Patient Education  2017 [employer].

## 2017-05-08 NOTE — Assessment & Plan Note (Signed)
Recent fasting glucose 97. Patient needs a repeat hemoglobin A1c at follow-up.

## 2017-05-08 NOTE — Assessment & Plan Note (Signed)
Patient is due for breast cancer screening. We are awaiting [employer] card or insurance approval. Patient is due for colon cancer screening in 2021. She is status post complete hysterectomy with cervix removal and does not need screening Pap smears.

## 2017-05-08 NOTE — Assessment & Plan Note (Signed)
Patient reports good control of reflux symptoms with PPI.  Assessment: GERD, controlled  Plan: -Continue current regimen

## 2017-05-08 NOTE — Assessment & Plan Note (Signed)
BP Readings from Last 3 Encounters:  05/08/17 138/64  03/14/17 (!) 169/102  06/20/16 (!) 150/79    Lab Results  Component Value Date   NA 142 11/15/2015   K 3.7 11/15/2015   CREATININE 0.83 11/15/2015    Assessment: Blood pressure control:  controlled Progress toward BP goal:   at goal, could be better controlled if going by the new guidelines Comments: Patient reports compliance with hydrochlorothiazide 25 mg daily. She is hesitant to add a new medication at this time.   Plan: Medications:  continue current medications Other plans: Counseled patient on diet and exercise. With weight loss, she would be at goal. Could consider adding lisinopril for additional blood pressure goal. Follow up in 3 months.

## 2017-05-09 NOTE — Progress Notes (Signed)
Internal Medicine Clinic Attending  Case discussed with Dr. Burns at the time of the visit.  We reviewed the resident's history and exam and pertinent patient test results.  I agree with the assessment, diagnosis, and plan of care documented in the resident's note.  

## 2017-06-05 ENCOUNTER — Encounter: Payer: Self-pay | Admitting: *Deleted

## 2017-08-02 ENCOUNTER — Emergency Department (HOSPITAL_COMMUNITY): Payer: Self-pay

## 2017-08-02 ENCOUNTER — Encounter (HOSPITAL_COMMUNITY): Payer: Self-pay | Admitting: Emergency Medicine

## 2017-08-02 ENCOUNTER — Emergency Department (HOSPITAL_COMMUNITY)
Admission: EM | Admit: 2017-08-02 | Discharge: 2017-08-02 | Disposition: A | Discharge status: Home / Self Care | Payer: Self-pay | Attending: Emergency Medicine | Admitting: Emergency Medicine

## 2017-08-02 DIAGNOSIS — R079 Chest pain, unspecified: Secondary | ICD-10-CM | POA: Insufficient documentation

## 2017-08-02 DIAGNOSIS — Z5321 Procedure and treatment not carried out due to patient leaving prior to being seen by health care provider: Secondary | ICD-10-CM | POA: Insufficient documentation

## 2017-08-02 LAB — BASIC METABOLIC PANEL
Anion gap: 11 (ref 5–15)
BUN: 9 mg/dL (ref 6–20)
CALCIUM: 9.3 mg/dL (ref 8.9–10.3)
CO2: 25 mmol/L (ref 22–32)
CREATININE: 0.96 mg/dL (ref 0.44–1.00)
Chloride: 102 mmol/L (ref 101–111)
GFR calc non Af Amer: >60 mL/min (ref >60)
Glucose, Bld: 97 mg/dL (ref 65–99)
Potassium: 3.4 mmol/L — ABNORMAL LOW (ref 3.5–5.1)
Sodium: 138 mmol/L (ref 135–145)

## 2017-08-02 LAB — CBC
HCT: 40.9 % (ref 36.0–46.0)
Hemoglobin: 13.8 g/dL (ref 12.0–15.0)
MCH: 29.2 pg (ref 26.0–34.0)
MCHC: 33.7 g/dL (ref 30.0–36.0)
MCV: 86.5 fL (ref 78.0–100.0)
PLATELETS: 309 10*3/uL (ref 150–400)
RBC: 4.73 MIL/uL (ref 3.87–5.11)
RDW: 12.8 % (ref 11.5–15.5)
WBC: 6.5 10*3/uL (ref 4.0–10.5)

## 2017-08-02 LAB — POCT I-STAT TROPONIN I: TROPONIN I, POC: 0 ng/mL (ref 0.00–0.08)

## 2017-08-02 NOTE — ED Triage Notes (Signed)
Pt states about 45 minutes ago she had a couple sharp pains in the center of her chest that radiated outward  Pt states the pains came and went  Pt states she is pain free at this time  Pt states she had some dizziness and felt very anxious

## 2017-11-02 ENCOUNTER — Encounter (HOSPITAL_COMMUNITY): Payer: Self-pay | Admitting: Emergency Medicine

## 2017-11-02 ENCOUNTER — Emergency Department (HOSPITAL_COMMUNITY)
Admission: EM | Admit: 2017-11-02 | Discharge: 2017-11-02 | Disposition: A | Discharge status: Home / Self Care | Payer: Self-pay | Attending: Emergency Medicine | Admitting: Emergency Medicine

## 2017-11-02 DIAGNOSIS — Z79899 Other long term (current) drug therapy: Secondary | ICD-10-CM | POA: Insufficient documentation

## 2017-11-02 DIAGNOSIS — I10 Essential (primary) hypertension: Secondary | ICD-10-CM | POA: Insufficient documentation

## 2017-11-02 DIAGNOSIS — I471 Supraventricular tachycardia: Secondary | ICD-10-CM | POA: Insufficient documentation

## 2017-11-02 LAB — CBC WITH DIFFERENTIAL/PLATELET
Basophils Absolute: 0 10*3/uL (ref 0.0–0.1)
Basophils Relative: 0 %
Eosinophils Absolute: 0.1 10*3/uL (ref 0.0–0.7)
Eosinophils Relative: 2 %
HEMATOCRIT: 44.1 % (ref 36.0–46.0)
HEMOGLOBIN: 14.3 g/dL (ref 12.0–15.0)
LYMPHS PCT: 55 %
Lymphs Abs: 4.1 10*3/uL — ABNORMAL HIGH (ref 0.7–4.0)
MCH: 28.5 pg (ref 26.0–34.0)
MCHC: 32.4 g/dL (ref 30.0–36.0)
MCV: 88 fL (ref 78.0–100.0)
Monocytes Absolute: 0.5 10*3/uL (ref 0.1–1.0)
Monocytes Relative: 7 %
NEUTROS ABS: 2.6 10*3/uL (ref 1.7–7.7)
NEUTROS PCT: 36 %
Platelets: 327 10*3/uL (ref 150–400)
RBC: 5.01 MIL/uL (ref 3.87–5.11)
RDW: 13.2 % (ref 11.5–15.5)
WBC: 7.4 10*3/uL (ref 4.0–10.5)

## 2017-11-02 LAB — I-STAT TROPONIN, ED: Troponin i, poc: 0.01 ng/mL (ref 0.00–0.08)

## 2017-11-02 LAB — BASIC METABOLIC PANEL
ANION GAP: 9 (ref 5–15)
BUN: 12 mg/dL (ref 6–20)
CO2: 21 mmol/L — AB (ref 22–32)
Calcium: 9 mg/dL (ref 8.9–10.3)
Chloride: 107 mmol/L (ref 101–111)
Creatinine, Ser: 0.93 mg/dL (ref 0.44–1.00)
GFR calc Af Amer: >60 mL/min (ref >60)
GLUCOSE: 139 mg/dL — AB (ref 65–99)
POTASSIUM: 3.8 mmol/L (ref 3.5–5.1)
Sodium: 137 mmol/L (ref 135–145)

## 2017-11-02 LAB — TSH: TSH: 2.419 u[IU]/mL (ref 0.350–4.500)

## 2017-11-02 MED ORDER — METOPROLOL SUCCINATE ER 25 MG PO TB24: 25.0000 mg | ORAL_TABLET | Freq: Every day | ORAL | 0 refills | Status: DC

## 2017-11-02 MED ORDER — METOPROLOL TARTRATE 5 MG/5ML IV SOLN
5.0000 mg | Freq: Once | INTRAVENOUS | Status: AC
  Administered 2017-11-02: 5 mg via INTRAVENOUS
  Filled 2017-11-02: qty 5

## 2017-11-02 MED ORDER — METOPROLOL SUCCINATE ER 25 MG PO TB24
25.0000 mg | ORAL_TABLET | Freq: Every day | ORAL | Status: DC
  Administered 2017-11-02: 25 mg via ORAL
  Filled 2017-11-02: qty 1

## 2017-11-02 MED ORDER — ADENOSINE 6 MG/2ML IV SOLN
6.0000 mg | Freq: Once | INTRAVENOUS | Status: AC
  Administered 2017-11-02: 6 mg via INTRAVENOUS
  Filled 2017-11-02: qty 2

## 2017-11-02 NOTE — Discharge Instructions (Signed)
Toprol 1 tablet daily Call [facility] Cardiology office above for an appointment. When you call, tell them at you were seen in [facility] ER with "First time SVT".

## 2017-11-02 NOTE — ED Notes (Signed)
IV left ac removed; site clean, dry, intact

## 2017-11-02 NOTE — ED Triage Notes (Signed)
Pt. Stated, I was in the [employer] parade and I started having SOB with chest pain, I had to go to [facility] and have a bowel movement and it felt better, but still felt the SOB.  Pt is clammy and SOB. In triage.

## 2017-11-02 NOTE — ED Provider Notes (Signed)
[facility] EMERGENCY DEPARTMENT Provider Note   CSN: [id number] Arrival date & time: 11/02/17  0815     History   Chief Complaint Chief Complaint  Patient presents with  . SVT/SOB    HPI Anita Hughes is a 57 y.o. female. Chief complaint is lightheaded and dizzy.  HPI 17 -year-old black female with no cardiac history. History of GERD, and hypertension. She had her husband were walking today to go to the [employer] oncoming [place]. She began to feel poorly with lightheadedness and dizziness. No sharp "pounding" they present here. Found to be in SVT upon her arrival.  No previous formal diagnosis. Has had several episodes over the last few years that resolved briefly and spontaneously. No chest pain. Not syncopal with this.  Past Medical History:  Diagnosis Date  . GERD (gastroesophageal reflux disease)   . Hypertension     Patient Active Problem List   Diagnosis Date Noted  . Hyperlipidemia 05/08/2017  . Pre-diabetes 11/15/2015  . Health care maintenance 11/15/2015  . Hypertension   . Obesity 03/09/2008  . ESOPHAGEAL STRICTURE 03/09/2008  . Hiatal hernia with gastroesophageal reflux 03/09/2008    Past Surgical History:  Procedure Laterality Date  . ABDOMINAL HYSTERECTOMY  2001   FIBROIDS  . CESAREAN SECTION  1990    OB History    Gravida Para Term Preterm AB Living   4 4 4     4    SAB TAB Ectopic Multiple Live Births           4       Home Medications    Prior to Admission medications   Medication Sig Start Date End Date Taking? Authorizing Provider  atorvastatin (LIPITOR) 20 MG tablet Take 1 tablet (20 mg total) by mouth daily. 05/08/17   Burns, Arloa Koh, MD  cetirizine (ZYRTEC ALLERGY) 10 MG tablet Take 1 tablet (10 mg total) by mouth daily. 03/14/17   Ahmed, Chesley Mires, MD  fluticasone (FLONASE) 50 MCG/ACT nasal spray Place 1 spray into both nostrils daily. 03/14/17   Ahmed, Chesley Mires, MD  hydrochlorothiazide (HYDRODIURIL) 25 MG tablet TAKE 1  TABLET BY MOUTH EVERY DAY 10/23/16   Burns, Arloa Koh, MD  metoprolol succinate (TOPROL-XL) 25 MG 24 hr tablet Take 1 tablet (25 mg total) by mouth daily. 11/02/17   Tanna Furry, MD  oxymetazoline (AFRIN NASAL SPRAY) 0.05 % nasal spray Place 1 spray into both nostrils 2 (two) times daily. 03/14/17   Dellia Nims, MD  pantoprazole (PROTONIX) 40 MG tablet Take 1 tablet (40 mg total) by mouth daily. 10/23/16   Florinda Marker, MD    Family History Family History  Problem Relation Age of Onset  . Hypertension Mother   . Heart attack Father     Social History Social History  Substance Use Topics  . Smoking status: Never Smoker  . Smokeless tobacco: Never Used  . Alcohol use No     Allergies   Patient has no known allergies.   Review of Systems Review of Systems  Constitutional: Negative for appetite change, chills, diaphoresis, fatigue and fever.  HENT: Negative for mouth sores, sore throat and trouble swallowing.   Eyes: Negative for visual disturbance.  Respiratory: Positive for shortness of breath. Negative for cough, chest tightness and wheezing.   Cardiovascular: Positive for palpitations. Negative for chest pain.  Gastrointestinal: Negative for abdominal distention, abdominal pain, diarrhea, nausea and vomiting.  Endocrine: Negative for polydipsia, polyphagia and polyuria.  Genitourinary: Negative for dysuria, frequency and  hematuria.  Musculoskeletal: Negative for gait problem.  Skin: Negative for color change, pallor and rash.  Neurological: Positive for dizziness and light-headedness. Negative for syncope and headaches.  Hematological: Does not bruise/bleed easily.  Psychiatric/Behavioral: Negative for behavioral problems and confusion.     Physical Exam Updated Vital Signs BP (!) 146/82   Pulse 86   Temp 98.7 F (37.1 C) (Oral)   Resp (!) 24   Ht 5' (1.524 m)   Wt 113.4 kg (250 lb)   LMP 03/21/2000   SpO2 96%   BMI 48.82 kg/m   Physical Exam    Constitutional: She is oriented to person, place, and time. She appears well-developed and well-nourished. No distress.  Distressed and anxious.  HENT:  Head: Normocephalic.  Eyes: Pupils are equal, round, and reactive to light. Conjunctivae are normal. No scleral icterus.  Neck: Normal range of motion. Neck supple. No thyromegaly present.  Cardiovascular: Normal rate and regular rhythm.  Exam reveals no gallop and no friction rub.   No murmur heard. Narrow complex rapid rhythm on monitor. Consistent with SVT.  Pulmonary/Chest: Effort normal and breath sounds normal. No respiratory distress. She has no wheezes. She has no rales.  Abdominal: Soft. Bowel sounds are normal. She exhibits no distension. There is no tenderness. There is no rebound.  Musculoskeletal: Normal range of motion.  Neurological: She is alert and oriented to person, place, and time.  Skin: Skin is warm and dry. No rash noted.  Psychiatric: She has a normal mood and affect. Her behavior is normal.     ED Treatments / Results  Labs (all labs ordered are listed, but only abnormal results are displayed) Labs Reviewed  CBC WITH DIFFERENTIAL/PLATELET - Abnormal; Notable for the following:       Result Value   Lymphs Abs 4.1 (*)    All other components within normal limits  BASIC METABOLIC PANEL - Abnormal; Notable for the following:    CO2 21 (*)    Glucose, Bld 139 (*)    All other components within normal limits  TSH  I-STAT TROPONIN, ED    EKG  EKG Interpretation None       Radiology No results found.  Procedures .Cardioversion Date/Time: 11/02/2017 10:11 AM Performed by: Marlee Armenteros, [place] by: Tanna Furry   Consent:    Consent obtained:  Verbal and written   Consent given by:  Patient and spouse   Risks discussed:  Induced arrhythmia   Alternatives discussed:  Alternative treatment, rate-control medication and no treatment Pre-procedure details:    Cardioversion basis:  Emergent   Rhythm:   Supraventricular tachycardia Patient sedated: No Attempt one:    Shock outcome:  Conversion to normal sinus rhythm Post-procedure details:    Patient status:  Alert   Patient tolerance of procedure:  Tolerated with difficulty Comments:     Patient cardioverted via Adenocard 6 mg IV push.     (including critical care time)  Medications Ordered in ED Medications  metoprolol succinate (TOPROL-XL) 24 hr tablet 25 mg (not administered)  adenosine (ADENOCARD) 6 MG/2ML injection 6 mg (6 mg Intravenous Given 11/02/17 0843)  metoprolol tartrate (LOPRESSOR) injection 5 mg (5 mg Intravenous Given 11/02/17 0850)     Initial Impression / Assessment and Plan / ED Course  I have reviewed the triage vital signs and the nursing notes.  Pertinent labs & imaging results that were available during my care of the patient were reviewed by me and considered in my medical decision making (see  chart for details).   Risks and benefits of Cardioversion discussed with the patient. Was given Adenocard 6 mg IV and medially converted to sinus rhythm without difficulty.     Final Clinical Impressions(s) / ED Diagnoses   Final diagnoses:  SVT (supraventricular tachycardia) ([place])    Reassuring basic labs. Symptom-free. Given Lopressor 5 mg. Maintain sinus rhythm rate in the 80s. She has not been on her blood pressure medication, hydrochlorothiazide. Whenever discontinued and treated with Toprol daily. EP cardiology referral. Daily 25 mg Toprol. ER with changes.  New Prescriptions New Prescriptions   METOPROLOL SUCCINATE (TOPROL-XL) 25 MG 24 HR TABLET    Take 1 tablet (25 mg total) by mouth daily.     Tanna Furry, MD 11/02/17 1012

## 2017-11-11 ENCOUNTER — Ambulatory Visit: Payer: Self-pay | Admitting: Cardiology

## 2017-11-27 ENCOUNTER — Ambulatory Visit: Payer: Self-pay

## 2017-11-28 ENCOUNTER — Ambulatory Visit: Payer: Self-pay | Admitting: Internal Medicine

## 2017-11-28 ENCOUNTER — Other Ambulatory Visit: Payer: Self-pay

## 2017-11-28 ENCOUNTER — Encounter: Payer: Self-pay | Admitting: Internal Medicine

## 2017-11-28 VITALS — BP 165/91 | HR 81 | Temp 98.0°F | Ht 65.0 in | Wt 238.4 lb

## 2017-11-28 DIAGNOSIS — K449 Diaphragmatic hernia without obstruction or gangrene: Secondary | ICD-10-CM

## 2017-11-28 DIAGNOSIS — I10 Essential (primary) hypertension: Secondary | ICD-10-CM

## 2017-11-28 DIAGNOSIS — K219 Gastro-esophageal reflux disease without esophagitis: Secondary | ICD-10-CM

## 2017-11-28 DIAGNOSIS — I471 Supraventricular tachycardia, unspecified: Secondary | ICD-10-CM | POA: Insufficient documentation

## 2017-11-28 MED ORDER — METOPROLOL SUCCINATE ER 25 MG PO TB24: 25.0000 mg | ORAL_TABLET | Freq: Every day | ORAL | 2 refills | Status: DC

## 2017-11-28 MED ORDER — HYDROCHLOROTHIAZIDE 25 MG PO TABS: 25.0000 mg | ORAL_TABLET | Freq: Every day | ORAL | 2 refills | Status: DC

## 2017-11-28 MED ORDER — PANTOPRAZOLE SODIUM 40 MG PO TBEC: 40.0000 mg | DELAYED_RELEASE_TABLET | Freq: Every day | ORAL | 11 refills | Status: DC

## 2017-11-28 NOTE — Assessment & Plan Note (Signed)
She is off HCTZ today. She ran out of this medicine and was not trying to restart it. Her blood pressure is moderately elevated in 160s today. She has no lower extremity edema after stopping this medicine. She is anxious about hypokalemia associated with HCTZ because she has had muscle cramps in the past and worries it could cause them. She requests some time after this visit to finish sorting out her insurance due to a change in jobs before returning for any additional procedures or tests. Assessment Hypertension uncontrolled at 165/91 off her HCTZ, goal <140/90 Plan Reordered HCTZ 25mg  daily RTC no later than 1 month for BMP to monitor potassium

## 2017-11-28 NOTE — Patient Instructions (Addendum)
FOLLOW-UP INSTRUCTIONS When: in about a month, once insurance status fixed For: Check potassium on hydrochlorothiazide What to bring: Outpatient blood pressure log  I do think we need to have you restart taking the hydrochlorothiazide. We will need to see you again and check your blood work for low potassium and make sure this is not a cause of trouble.

## 2017-11-28 NOTE — Assessment & Plan Note (Addendum)
She is having more reflux symptoms after running out of her protonix. It was previously helping significantly with symptoms. She denies any abdominal soreness or pain except heartburn symptoms. She is watching her diet closely because she has multiple food triggers. There is no difference with solids or liquids. Assessment Chronic GERD associated with hiatal hernia, on PRN or scheduled treatment indefinitely Plan Reordered pantoprazole 40mg  daily

## 2017-11-28 NOTE — Assessment & Plan Note (Addendum)
She had a sustained run of SVTs provoked by fairly minimal exertion earlier this month, at [employer] homecoming march. This was converted with adenosine. She thinks she had one episode of palpitations since then but it resolved in a few seconds on its own. She is taking the metoprolol succinate 25mg  daily without problems. Her heart rate is 81 and regular in clinic today. Assessment SVT episode without associated signs or symptoms with benign EKG from 11/3. She may have had a brief episodes once since that time. Her heart rate is okay today but could tolerate additional beta blocker or nodal blocking agents if needed. I also recommended her to avoid using stimulant medications such as Afrin which she has used for congestion previously. Plan Reordered toprol xl 25mg  daily She agreed to call back to clinic if symptoms start again at home on current medications.

## 2017-11-28 NOTE — Progress Notes (Signed)
   CC: Follow up from ED visit for heart palpitations, medication refills for GERD and HTN  HPI:  [name] is a 57 y.o. female with PMHx detailed below presenting with heart palpitations recently seen in the ED on 11/3 for SVT which was cardioverted with adenosine.  See problem based assessment and plan below for additional details.  Supraventricular tachycardia ([place]) She had a sustained run of SVTs provoked by fairly minimal exertion earlier this month, at [employer] [facility] march. This was converted with adenosine. She thinks she had one episode of palpitations since then but it resolved in a few seconds on its own. She is taking the metoprolol succinate 25mg  daily without problems. Her heart rate is 81 and regular in clinic today. Assessment SVT episode without associated signs or symptoms with benign EKG from 11/3. She may have had a brief episodes once since that time. Her heart rate is okay today but could tolerate additional beta blocker or nodal blocking agents if needed. I also recommended her to avoid using stimulant medications such as Afrin which she has used for congestion previously. Plan Reordered toprol xl 25mg  daily She agreed to call back to clinic if symptoms start again at home on current medications.  Hiatal hernia with gastroesophageal reflux She is having more reflux symptoms after running out of her protonix. It was previously helping significantly with symptoms. She denies any abdominal soreness or pain except heartburn symptoms. She is watching her diet closely because she has multiple food triggers. There is no difference with solids or liquids. Assessment Chronic GERD associated with hiatal hernia, on PRN or scheduled treatment indefinitely Plan Reordered pantoprazole 40mg  daily  Hypertension She is off HCTZ today. She ran out of this medicine and was not trying to restart it. Her blood pressure is moderately elevated in 160s today. She has no lower  extremity edema after stopping this medicine. She is anxious about hypokalemia associated with HCTZ because she has had muscle cramps in the past and worries it could cause them. She requests some time after this visit to finish sorting out her insurance due to a change in jobs before returning for any additional procedures or tests. Assessment Hypertension uncontrolled at 165/91 off her HCTZ, goal <140/90 Plan Reordered HCTZ 25mg  daily RTC no later than 1 month for BMP to monitor potassium    Past Medical History:  Diagnosis Date  . GERD (gastroesophageal reflux disease)   . Hypertension     Review of Systems: Review of Systems  Respiratory: Negative for shortness of breath.   Cardiovascular: Positive for palpitations. Negative for chest pain.  Gastrointestinal: Positive for heartburn. Negative for constipation, diarrhea, nausea and vomiting.  Musculoskeletal: Negative for falls.  Neurological: Negative for dizziness and loss of consciousness.     Physical Exam: Vitals:   11/28/17 0910  BP: (!) 165/91  Pulse: 81  Temp: 98 F (36.7 C)  TempSrc: Oral  SpO2: 100%  Weight: 238 lb 6.4 oz (108.1 kg)  Height: 5\' 5"  (1.651 m)   GENERAL- alert, co-operative, NAD CARDIAC- RRR, no murmurs, rubs or gallops. RESP- CTAB, no wheezes or crackles.  ABDOMEN- Soft, nontender, no guarding or rebound EXTREMITIES- Symmetric, no pedal edema. SKIN- Warm, dry, No rash or lesion.    Assessment & Plan:   See encounters tab for problem based medical decision making.   Patient discussed with Dr. Daryll Drown

## 2017-11-29 NOTE — Progress Notes (Signed)
Internal Medicine Clinic Attending  Case discussed with Dr. Rice at the time of the visit.  We reviewed the resident's history and exam and pertinent patient test results.  I agree with the assessment, diagnosis, and plan of care documented in the resident's note.  

## 2017-12-27 ENCOUNTER — Encounter: Payer: Self-pay | Admitting: Internal Medicine

## 2017-12-27 ENCOUNTER — Ambulatory Visit: Payer: Self-pay

## 2018-02-07 ENCOUNTER — Encounter (INDEPENDENT_AMBULATORY_CARE_PROVIDER_SITE_OTHER): Payer: Self-pay

## 2018-02-07 ENCOUNTER — Ambulatory Visit: Payer: 59 | Admitting: Internal Medicine

## 2018-02-07 ENCOUNTER — Encounter: Payer: Self-pay | Admitting: Internal Medicine

## 2018-02-07 ENCOUNTER — Other Ambulatory Visit: Payer: Self-pay

## 2018-02-07 DIAGNOSIS — J069 Acute upper respiratory infection, unspecified: Secondary | ICD-10-CM

## 2018-02-07 DIAGNOSIS — Z79899 Other long term (current) drug therapy: Secondary | ICD-10-CM

## 2018-02-07 DIAGNOSIS — B9789 Other viral agents as the cause of diseases classified elsewhere: Secondary | ICD-10-CM | POA: Diagnosis not present

## 2018-02-07 DIAGNOSIS — J019 Acute sinusitis, unspecified: Secondary | ICD-10-CM

## 2018-02-07 MED ORDER — FLUTICASONE PROPIONATE 50 MCG/ACT NA SUSP: 1.0000 | Freq: Every day | NASAL | 2 refills | Status: DC

## 2018-02-07 MED ORDER — GUAIFENESIN-CODEINE 100-10 MG/5ML PO SOLN: 5.0000 mL | Freq: Four times a day (QID) | ORAL | 0 refills | Status: DC | PRN

## 2018-02-07 MED ORDER — CETIRIZINE HCL 10 MG PO TABS: 10.0000 mg | ORAL_TABLET | Freq: Every day | ORAL | 2 refills | Status: DC

## 2018-02-07 NOTE — Patient Instructions (Addendum)
FOLLOW-UP INSTRUCTIONS When: as needed with PCP For: health care maintenance What to bring: your medications  I think you have a viral upper respiratory infection.  We will treat your symptoms with: 1. Flonase nasal spray 2. Zyrtec allergy medication 3. Frequent saline nasal irrigation 4. Cough syrup to help with your constant cough  IF not improving in the next week please come back to see [place].

## 2018-02-07 NOTE — Assessment & Plan Note (Signed)
Assessment: Symptoms are consistent with viral URI.  She is agreeable to symptomatic treatment.  At this point, do not think that antibiotics are needed in this patient.  Plan: - Flonase intranasal spray - Zyrtec antihistamine - Frequent saline nasal irrigation - Robit[place]uin AC to help with cough - RTC 1 week if not better

## 2018-02-07 NOTE — Progress Notes (Signed)
   CC: cough and congestion  HPI:  [name] is a 58 y.o. woman presenting with cough and congestion x 1.5 weeks.  Patient reports symptoms of ear pain and discomfort followed by nasal congestion, sinus pressure, post-nasal drip and a dry cough that keeps her up at night.  She reports sick contacts at work.  She has a sore throat from coughing so much.  Her cough is not productive.  She denies fever, chills, vision changes, myalgias, abdominal pain, GI or GU complaints.  She has tried benadryl OTC that makes her drowsy.  She has used a warm towel over her face for temporary reflief.  Her biggest complaint today is coughing at night.  Past Medical History:  Diagnosis Date  . GERD (gastroesophageal reflux disease)   . Hypertension    Review of Systems:  Please see pertinent ROS reviewed in HPI and problem based charting.   Physical Exam:  Vitals:   02/07/18 1015  BP: (!) 145/82  Pulse: 89  Temp: 98 F (36.7 C)  TempSrc: Oral  SpO2: 100%  Weight: 236 lb 6.4 oz (107.2 kg)   Physical Exam  Constitutional: She is oriented to person, place, and time and well-developed, well-nourished, and in no distress.  HENT:  Head: Normocephalic and atraumatic.  Mouth/Throat: Oropharynx is clear and moist. No oropharyngeal exudate.  Cardiovascular: Normal rate and regular rhythm.  Pulmonary/Chest: Effort normal and breath sounds normal. She has no wheezes. She has no rales.  Neurological: She is alert and oriented to person, place, and time.  Skin: Skin is warm and dry.  Psychiatric: Mood and affect normal.     Assessment & Plan:   See Encounters Tab for problem based charting.  Patient discussed with Dr. Angelia Mould .  Viral URI with cough Assessment: Symptoms are consistent with viral URI.  She is agreeable to symptomatic treatment.  At this point, do not think that antibiotics are needed in this patient.  Plan: - Flonase intranasal spray - Zyrtec antihistamine - Frequent  saline nasal irrigation - Robitssuin AC to help with cough - RTC 1 week if not better

## 2018-02-10 ENCOUNTER — Telehealth: Payer: Self-pay | Admitting: Internal Medicine

## 2018-02-10 DIAGNOSIS — I471 Supraventricular tachycardia: Secondary | ICD-10-CM

## 2018-02-10 NOTE — Telephone Encounter (Signed)
Approved metoprolol. Will need to see in clinic soon for evaluation of her HTN and SVT.  Looks like she is scheduled soon.  Thank you,  Kathi Ludwig, MD

## 2018-02-11 ENCOUNTER — Ambulatory Visit: Payer: 59

## 2018-02-11 NOTE — Telephone Encounter (Signed)
Dr. Berline Lopes will not have any openings until May.  Is that okay or does patient need to be seen sooner in [facility]? Please advise.

## 2018-02-16 NOTE — Progress Notes (Signed)
Internal Medicine Clinic Attending  Case discussed with Dr. Wallace at the time of the visit.  We reviewed the resident's history and exam and pertinent patient test results.  I agree with the assessment, diagnosis, and plan of care documented in the resident's note.  

## 2018-02-24 ENCOUNTER — Other Ambulatory Visit: Payer: Self-pay

## 2018-02-24 DIAGNOSIS — I471 Supraventricular tachycardia: Secondary | ICD-10-CM

## 2018-02-24 NOTE — Telephone Encounter (Signed)
Per pharmacy, they have a rx on file-they will fill rx.  Attempted to contact patient and let there know-no answer, HIPPA compliant message left on pt's recorder stating rx already on file at pharmacy.Despina Hidden Cassady2/25/20192:28 PM

## 2018-02-24 NOTE — Telephone Encounter (Signed)
metoprolol succinate (TOPROL-XL) 25 MG 24 hr tablet, [facility].

## 2018-04-14 ENCOUNTER — Other Ambulatory Visit: Payer: Self-pay | Admitting: Internal Medicine

## 2018-04-14 DIAGNOSIS — I10 Essential (primary) hypertension: Secondary | ICD-10-CM

## 2018-04-23 ENCOUNTER — Encounter: Payer: Self-pay | Admitting: Cardiovascular Disease

## 2018-05-05 NOTE — Progress Notes (Signed)
Cardiology Office Note   Date:  05/08/2018   ID:  Anita Hughes, DOB April 19, 1960, MRN [id number]  PCP:  Levin Bacon, NP  Cardiologist:   Jenkins Rouge, MD   No chief complaint on file.     History of Present Illness: Anita Hughes is a 58 y.o. female who presents for consultation regarding SVT. Referred by [facility] and Dr Anita Hughes. Reviewed ER notes from 11/02/17 History of HTN Rx with diuretic.Was at [employer] [facility] when she had rapid palpitations dizziness Narrow complex tachycardia with no pre excitation Converted in ER with adenosine D/c with Toprol 25 mg daily.  She has been under stress as one of her 35 yo grand daughters was taken by her dad and court Proceedings are stressful  No excess caffeine drugs alcohol    Past Medical History:  Diagnosis Date  . GERD (gastroesophageal reflux disease)   . Hypertension     Past Surgical History:  Procedure Laterality Date  . ABDOMINAL HYSTERECTOMY  2001   FIBROIDS  . CESAREAN SECTION  1990     Current Outpatient Medications  Medication Sig Dispense Refill  . hydrochlorothiazide (HYDRODIURIL) 25 MG tablet TAKE 1 TABLET BY MOUTH EVERY DAY 30 tablet 2  . metoprolol succinate (TOPROL-XL) 25 MG 24 hr tablet TAKE 1 TABLET BY MOUTH EVERY DAY 30 tablet 2  . pantoprazole (PROTONIX) 40 MG tablet Take 1 tablet (40 mg total) by mouth daily. 30 tablet 11   No current facility-administered medications for this visit.     Allergies:   Patient has no known allergies.    Social History:  The patient  reports that she has never smoked. She has never used smokeless tobacco. She reports that she does not drink alcohol or use drugs.   Family History:  The patient's family history includes Heart attack in her father; Hypertension in her mother.    ROS:  Please see the history of present illness.   Otherwise, review of systems are positive for none.   All other systems are reviewed and negative.    PHYSICAL EXAM: VS:  BP  116/80   Pulse 78   Ht 5\' 5"  (1.651 m)   Wt 238 lb 4 oz (108.1 kg)   LMP 03/21/2000   SpO2 99%   BMI 39.65 kg/m  , BMI Body mass index is 39.65 kg/m. Affect appropriate Healthy:  appears stated age 25: normal Neck supple with no adenopathy JVP normal no bruits no thyromegaly Lungs clear with no wheezing and good diaphragmatic motion Heart:  S1/S2 no murmur, no rub, gallop or click PMI normal Abdomen: benighn, BS positve, no tenderness, no AAA no bruit.  No HSM or HJR Distal pulses intact with no bruits No edema Neuro non-focal Skin warm and dry No muscular weakness    EKG:  11/02/17  SVT narrow complex rate 175 bpm post conversion SR rate 80 insignificant inferolateral Q waves normal ST segments and QT    Recent Labs: 11/02/2017: BUN 12; Creatinine, Ser 0.93; Hemoglobin 14.3; Platelets 327; Potassium 3.8; Sodium 137; TSH 2.419    Lipid Panel    Component Value Date/Time   CHOL 244 (H) 03/21/2012 1528   TRIG 98 03/21/2012 1528   HDL 50 03/21/2012 1528   CHOLHDL 4.9 03/21/2012 1528   VLDL 20 03/21/2012 1528   LDLCALC 174 (H) 03/21/2012 1528      Wt Readings from Last 3 Encounters:  05/08/18 238 lb 4 oz (108.1 kg)  02/07/18 236 lb 6.4  oz (107.2 kg)  11/28/17 238 lb 6.4 oz (108.1 kg)      Other studies Reviewed: Additional studies/ records that were reviewed today include: Notes from ER , labs ECG notes from [facility] .    ASSESSMENT AND PLAN:  1.  SVT  Isolated with no WPW and normal baseline ECG continue beta blocker f/u EP for recurrence Check echo to r/o structural heart disease  2. HTN Well controlled.  Continue current medications and low sodium Dash type diet.   3. GERD discussed weight loss and low carb diet protonix as needed    Current medicines are reviewed at length with the patient today.  The patient does not have concerns regarding medicines.  The following changes have been made:  no change  Labs/ tests ordered today  include: TTE  Orders Placed This Encounter  Procedures  . CBC with Differential/Platelet  . Comprehensive metabolic panel  . Lipid panel  . Hemoglobin A1c  . TSH  . ECHOCARDIOGRAM COMPLETE     Disposition:   FU with EP as needed for any further episodes of SVT      Signed, Jenkins Rouge, MD  05/08/2018 5:03 PM    [facility] [address], [place], [place]  [address] Phone: [phone]; Fax: [phone]

## 2018-05-08 ENCOUNTER — Encounter: Payer: Self-pay | Admitting: Cardiovascular Disease

## 2018-05-08 ENCOUNTER — Ambulatory Visit: Payer: BLUE CROSS/BLUE SHIELD | Admitting: Cardiovascular Disease

## 2018-05-08 VITALS — BP 116/80 | HR 78 | Ht 65.0 in | Wt 238.2 lb

## 2018-05-08 DIAGNOSIS — R002 Palpitations: Secondary | ICD-10-CM

## 2018-05-08 DIAGNOSIS — I471 Supraventricular tachycardia: Secondary | ICD-10-CM | POA: Diagnosis not present

## 2018-05-08 DIAGNOSIS — I10 Essential (primary) hypertension: Secondary | ICD-10-CM

## 2018-05-08 NOTE — Patient Instructions (Addendum)
Medication Instructions:  Your physician recommends that you continue on your current medications as directed. Please refer to the Current Medication list given to you today.  Labwork: Your physician recommends that you return for lab work in: one day for fasting  lipid panel, CBC, CMET, Hgb A1c,  Testing/Procedures: Your physician has requested that you have an echocardiogram. Echocardiography is a painless test that uses sound waves to create images of your heart. It provides your doctor with information about the size and shape of your heart and how well your heart's chambers and valves are working. This procedure takes approximately one hour. There are no restrictions for this procedure.  Follow-Up: Your physician wants you to follow-up as needed with Dr. Johnsie Cancel.  If you need a refill on your cardiac medications before your next appointment, please call your pharmacy.

## 2018-05-09 ENCOUNTER — Other Ambulatory Visit: Payer: BLUE CROSS/BLUE SHIELD | Admitting: *Deleted

## 2018-05-09 DIAGNOSIS — R002 Palpitations: Secondary | ICD-10-CM

## 2018-05-09 DIAGNOSIS — I471 Supraventricular tachycardia: Secondary | ICD-10-CM

## 2018-05-09 DIAGNOSIS — I10 Essential (primary) hypertension: Secondary | ICD-10-CM

## 2018-05-10 LAB — COMPREHENSIVE METABOLIC PANEL
ALBUMIN: 3.8 g/dL (ref 3.5–5.5)
ALT: 17 IU/L (ref 0–32)
AST: 15 IU/L (ref 0–40)
Albumin/Globulin Ratio: 1.4 (ref 1.2–2.2)
Alkaline Phosphatase: 70 IU/L (ref 39–117)
BUN / CREAT RATIO: 17 (ref 9–23)
BUN: 13 mg/dL (ref 6–24)
Bilirubin Total: 0.3 mg/dL (ref 0.0–1.2)
CO2: 24 mmol/L (ref 20–29)
CREATININE: 0.76 mg/dL (ref 0.57–1.00)
Calcium: 9.5 mg/dL (ref 8.7–10.2)
Chloride: 101 mmol/L (ref 96–106)
GFR calc Af Amer: 101 mL/min/{1.73_m2} (ref >59)
GFR, EST NON AFRICAN AMERICAN: 87 mL/min/{1.73_m2} (ref >59)
GLOBULIN, TOTAL: 2.8 g/dL (ref 1.5–4.5)
Glucose: 107 mg/dL — ABNORMAL HIGH (ref 65–99)
Potassium: 3.8 mmol/L (ref 3.5–5.2)
SODIUM: 140 mmol/L (ref 134–144)
Total Protein: 6.6 g/dL (ref 6.0–8.5)

## 2018-05-10 LAB — CBC WITH DIFFERENTIAL/PLATELET
Basophils Absolute: 0 10*3/uL (ref 0.0–0.2)
Basos: 1 %
EOS (ABSOLUTE): 0.2 10*3/uL (ref 0.0–0.4)
EOS: 3 %
HEMATOCRIT: 38.7 % (ref 34.0–46.6)
Hemoglobin: 13.5 g/dL (ref 11.1–15.9)
Immature Grans (Abs): 0 10*3/uL (ref 0.0–0.1)
Immature Granulocytes: 0 %
Lymphocytes Absolute: 2.8 10*3/uL (ref 0.7–3.1)
Lymphs: 45 %
MCH: 30 pg (ref 26.6–33.0)
MCHC: 34.9 g/dL (ref 31.5–35.7)
MCV: 86 fL (ref 79–97)
Monocytes Absolute: 0.4 10*3/uL (ref 0.1–0.9)
Monocytes: 7 %
NEUTROS ABS: 2.7 10*3/uL (ref 1.4–7.0)
Neutrophils: 44 %
Platelets: 305 10*3/uL (ref 150–379)
RBC: 4.5 x10E6/uL (ref 3.77–5.28)
RDW: 13 % (ref 12.3–15.4)
WBC: 6 10*3/uL (ref 3.4–10.8)

## 2018-05-10 LAB — LIPID PANEL
CHOL/HDL RATIO: 4.4 ratio (ref 0.0–4.4)
Cholesterol, Total: 212 mg/dL — ABNORMAL HIGH (ref 100–199)
HDL: 48 mg/dL (ref >39)
LDL CALC: 136 mg/dL — AB (ref 0–99)
TRIGLYCERIDES: 140 mg/dL (ref 0–149)
VLDL CHOLESTEROL CAL: 28 mg/dL (ref 5–40)

## 2018-05-10 LAB — HEMOGLOBIN A1C
Est. average glucose Bld gHb Est-mCnc: 128 mg/dL
Hgb A1c MFr Bld: 6.1 % — ABNORMAL HIGH (ref 4.8–5.6)

## 2018-05-10 LAB — TSH: TSH: 2.39 u[IU]/mL (ref 0.450–4.500)

## 2018-05-12 ENCOUNTER — Ambulatory Visit (HOSPITAL_COMMUNITY): Payer: BLUE CROSS/BLUE SHIELD

## 2018-05-13 ENCOUNTER — Other Ambulatory Visit: Payer: Self-pay | Admitting: Internal Medicine

## 2018-05-13 DIAGNOSIS — I471 Supraventricular tachycardia: Secondary | ICD-10-CM

## 2018-05-14 ENCOUNTER — Encounter: Payer: 59 | Admitting: Internal Medicine

## 2018-05-16 ENCOUNTER — Other Ambulatory Visit: Payer: Self-pay

## 2018-05-16 ENCOUNTER — Ambulatory Visit (HOSPITAL_COMMUNITY): Payer: BLUE CROSS/BLUE SHIELD | Attending: Cardiovascular Disease

## 2018-05-16 DIAGNOSIS — I10 Essential (primary) hypertension: Secondary | ICD-10-CM

## 2018-05-16 DIAGNOSIS — I471 Supraventricular tachycardia: Secondary | ICD-10-CM | POA: Insufficient documentation

## 2018-05-16 DIAGNOSIS — I119 Hypertensive heart disease without heart failure: Secondary | ICD-10-CM | POA: Diagnosis not present

## 2018-05-16 DIAGNOSIS — R002 Palpitations: Secondary | ICD-10-CM | POA: Insufficient documentation

## 2018-07-02 ENCOUNTER — Encounter: Payer: BLUE CROSS/BLUE SHIELD | Admitting: Internal Medicine

## 2018-07-02 ENCOUNTER — Encounter: Payer: Self-pay | Admitting: Family Medicine

## 2018-07-02 NOTE — Progress Notes (Deleted)
   CC: Routine evaluation of her HTN, and SVT  HPI:Anita Hughes is a 58 y.o.   HTN:  SVT: Echo completed on 05/16/2018- LVEF of 55-60%, G1DD  Prediabetes and obesity: Discuss dietary changes and seeing [place] Maintenance: Mammogram   Past Medical History:  Diagnosis Date  . GERD (gastroesophageal reflux disease)   . Hypertension    Review of Systems:  ***  Physical Exam:  There were no vitals filed for this visit. ***  Assessment & Plan:   See Encounters Tab for problem based charting.  Patient {GC/GE:[id number]::"discussed with","seen with"} Dr. {NAMES:[id number]::"Butcher","Granfortuna","E. Hoffman","Klima","Mullen","Narendra","Raines","Vincent"}

## 2018-08-18 ENCOUNTER — Other Ambulatory Visit: Payer: Self-pay | Admitting: Internal Medicine

## 2018-08-18 DIAGNOSIS — I10 Essential (primary) hypertension: Secondary | ICD-10-CM

## 2018-09-15 ENCOUNTER — Other Ambulatory Visit: Payer: Self-pay | Admitting: Internal Medicine

## 2018-09-15 DIAGNOSIS — I471 Supraventricular tachycardia: Secondary | ICD-10-CM

## 2018-09-23 ENCOUNTER — Other Ambulatory Visit: Payer: Self-pay | Admitting: Internal Medicine

## 2018-09-23 DIAGNOSIS — I10 Essential (primary) hypertension: Secondary | ICD-10-CM

## 2018-10-10 ENCOUNTER — Other Ambulatory Visit: Payer: Self-pay | Admitting: Internal Medicine

## 2018-10-10 DIAGNOSIS — I471 Supraventricular tachycardia: Secondary | ICD-10-CM

## 2019-09-05 ENCOUNTER — Encounter (HOSPITAL_COMMUNITY): Payer: Self-pay

## 2019-09-05 ENCOUNTER — Ambulatory Visit (HOSPITAL_COMMUNITY)
Admission: EM | Admit: 2019-09-05 | Discharge: 2019-09-05 | Disposition: A | Discharge status: Home / Self Care | Payer: 59 | Attending: Family Medicine | Admitting: Family Medicine

## 2019-09-05 ENCOUNTER — Other Ambulatory Visit: Payer: Self-pay

## 2019-09-05 DIAGNOSIS — R197 Diarrhea, unspecified: Secondary | ICD-10-CM | POA: Insufficient documentation

## 2019-09-05 DIAGNOSIS — R110 Nausea: Secondary | ICD-10-CM | POA: Diagnosis not present

## 2019-09-05 DIAGNOSIS — Z20828 Contact with and (suspected) exposure to other viral communicable diseases: Secondary | ICD-10-CM | POA: Diagnosis not present

## 2019-09-05 MED ORDER — ONDANSETRON 4 MG PO TBDP: 4.0000 mg | ORAL_TABLET | Freq: Three times a day (TID) | ORAL | 0 refills | Status: DC | PRN

## 2019-09-05 MED ORDER — DICYCLOMINE HCL 10 MG PO CAPS: ORAL_CAPSULE | ORAL | 0 refills | Status: DC

## 2019-09-05 NOTE — ED Provider Notes (Signed)
  [place]    CSN: [id number] Arrival date & time: 09/05/19  1142  Chief Complaint  Patient presents with  . Nausea  . Diarrhea  . Dizziness  . Exposure to covid 19   Subjective Anita Hughes is a 59 y.o. 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 GERD (gastroesophageal reflux disease)   . Hypertension    Past Surgical History:  Procedure Laterality Date  . ABDOMINAL HYSTERECTOMY  2001   FIBROIDS  . CESAREAN SECTION  1990   Current Outpatient Medications on File Prior to Encounter  Medication Sig Dispense Refill  . hydrochlorothiazide (HYDRODIURIL) 25 MG tablet TAKE 1 TABLET BY MOUTH EVERY DAY 30 tablet 2  . metoprolol succinate (TOPROL-XL) 25 MG 24 hr tablet TAKE 1 TABLET BY MOUTH EVERY DAY 30 tablet 2  . pantoprazole (PROTONIX) 40 MG tablet Take 1 tablet (40 mg total) by mouth daily. 30 tablet 11   No Known Allergies  Review of Systems Constitutional:  No fevers or chills Ear/Nose/Mouth/Throat:  No red eyes Gastrointestinal:  As noted in the HPI Musculoskeletal/Extremities: no myalgias Integumentary (Skin/Breast): no rash  Exam BP (!) 160/83 (BP Location: Right Arm)   Pulse 79   Temp 98.5 F (36.9 C) (Oral)   Resp 16   LMP 03/21/2000   SpO2 100%  General:  well developed, well hydrated, in no apparent distress Skin:  warm, no pallor or diaphoresis, no rashes Ears: Neg b/l Throat/Pharynx:  lips and gingiva without lesion; tongue and uvula midline; non-inflamed pharynx; no exudates or postnasal drainage Neck: neck supple without adenopathy, thyromegaly, or masses Lungs:  clear to auscultation, breath sounds equal  bilaterally, no respiratory distress, no wheezes Cardio:  regular rate and rhythm without murmurs Abdomen:  abdomen soft, nontender; bowel sounds normal; no masses or organomegaly Extremities:  no clubbing, cyanosis, or edema Psych: Age appropriate behavior and responsive to exam  Final Clinical Impressions(s) / UC Diagnoses   Final diagnoses:  Nausea  Diarrhea of presumed infectious origin     Discharge Instructions     Stay hydrated.  Drink Gatorade or something with electrolytes while you are having diarrhea or vomiting.  The dicyclomine is in case you have cramping in the abdominal region.  Red flags: Shortness of breath or not being able to keep up with fluids. Go to ER.  Wear a mask around family members, don't share drinks/towels, relegate yourself to the guest room.  ~3 days to get results of your test back.     ED Prescriptions    Medication Sig Dispense Auth. Provider   dicyclomine (BENTYL) 10 MG capsule Take 1 tab every 6 hours as needed for abdominal cramping. 60 capsule Shelda Pal, DO   ondansetron (ZOFRAN-ODT) 4 MG disintegrating tablet Take 1 tablet (4 mg total) by mouth every 8 (eight) hours as needed for nausea or vomiting. 20 tablet Shelda Pal, DO      Riki Sheer [place], [place] 09/05/19 1313

## 2019-09-05 NOTE — Discharge Instructions (Signed)
Stay hydrated.  Drink Gatorade or something with electrolytes while you are having diarrhea or vomiting.  The dicyclomine is in case you have cramping in the abdominal region.  Red flags: Shortness of breath or not being able to keep up with fluids. Go to ER.  Wear a mask around family members, don't share drinks/towels, relegate yourself to the guest room.  ~3 days to get results of your test back.

## 2019-09-05 NOTE — ED Triage Notes (Addendum)
Pt present lightheaded, diarrhea and nausea. Symptoms started last night.  While triage patient she informed me that she also has been exposed to covid-19.

## 2019-09-06 LAB — NOVEL CORONAVIRUS, NAA (HOSP ORDER, SEND-OUT TO REF LAB; TAT 18-24 HRS): SARS-CoV-2, NAA: NOT DETECTED

## 2020-03-18 ENCOUNTER — Ambulatory Visit: Payer: 59 | Attending: Internal Medicine

## 2020-03-18 DIAGNOSIS — Z23 Encounter for immunization: Secondary | ICD-10-CM

## 2020-03-18 NOTE — Progress Notes (Signed)
   Covid-19 Vaccination Clinic  Name:  Anita Hughes    MRN: [id number] DOB: 20-Jan-1960  03/18/2020  [name] was observed post Covid-19 immunization for 15 minutes without incident. She was provided with Vaccine Information Sheet and instruction to access the V-Safe system.   [name] was instructed to call 911 with any severe reactions post vaccine: Marland Kitchen Difficulty breathing  . Swelling of face and throat  . A fast heartbeat  . A bad rash all over body  . Dizziness and weakness   Immunizations Administered    Name Date Dose VIS Date Route   Pfizer COVID-19 Vaccine 03/18/2020  2:58 PM 0.3 mL 12/11/2019 Intramuscular   Manufacturer: [place]   Lot: [id number]   [place]: [id number]

## 2020-04-13 ENCOUNTER — Ambulatory Visit: Payer: 59 | Attending: Internal Medicine

## 2020-04-13 DIAGNOSIS — Z23 Encounter for immunization: Secondary | ICD-10-CM

## 2020-04-13 NOTE — Progress Notes (Signed)
   Covid-19 Vaccination Clinic  Name:  Anita Hughes    MRN: [id number] DOB: Jun 05, 1960  04/13/2020  Anita Hughes was observed post Covid-19 immunization for 15 minutes without incident. She was provided with Vaccine Information Sheet and instruction to access the V-Safe system.   Anita Hughes was instructed to call 911 with any severe reactions post vaccine: Marland Kitchen Difficulty breathing  . Swelling of face and throat  . A fast heartbeat  . A bad rash all over body  . Dizziness and weakness   Immunizations Administered    Name Date Dose VIS Date Route   Pfizer COVID-19 Vaccine 04/13/2020  8:14 AM 0.3 mL 12/11/2019 Intramuscular   Manufacturer: [employer]   Lot: [id number]   [place]: [id number]

## 2020-12-11 ENCOUNTER — Emergency Department (HOSPITAL_COMMUNITY)
Admission: EM | Admit: 2020-12-11 | Discharge: 2020-12-11 | Disposition: A | Discharge status: Home / Self Care | Payer: 59 | Attending: Emergency Medicine | Admitting: Emergency Medicine

## 2020-12-11 ENCOUNTER — Other Ambulatory Visit: Payer: Self-pay

## 2020-12-11 ENCOUNTER — Encounter (HOSPITAL_COMMUNITY): Payer: Self-pay | Admitting: Emergency Medicine

## 2020-12-11 DIAGNOSIS — I10 Essential (primary) hypertension: Secondary | ICD-10-CM | POA: Diagnosis not present

## 2020-12-11 DIAGNOSIS — I471 Supraventricular tachycardia: Secondary | ICD-10-CM | POA: Insufficient documentation

## 2020-12-11 DIAGNOSIS — R002 Palpitations: Secondary | ICD-10-CM | POA: Diagnosis present

## 2020-12-11 LAB — BASIC METABOLIC PANEL
Anion gap: 13 (ref 5–15)
BUN: 11 mg/dL (ref 6–20)
CO2: 19 mmol/L — ABNORMAL LOW (ref 22–32)
Calcium: 9.1 mg/dL (ref 8.9–10.3)
Chloride: 106 mmol/L (ref 98–111)
Creatinine, Ser: 0.86 mg/dL (ref 0.44–1.00)
GFR, Estimated: >60 mL/min (ref >60)
Glucose, Bld: 166 mg/dL — ABNORMAL HIGH (ref 70–99)
Potassium: 4 mmol/L (ref 3.5–5.1)
Sodium: 138 mmol/L (ref 135–145)

## 2020-12-11 LAB — HEPATIC FUNCTION PANEL
ALT: 60 U/L — ABNORMAL HIGH (ref 0–44)
AST: 78 U/L — ABNORMAL HIGH (ref 15–41)
Albumin: 3.8 g/dL (ref 3.5–5.0)
Alkaline Phosphatase: 69 U/L (ref 38–126)
Bilirubin, Direct: 0.1 mg/dL (ref 0.0–0.2)
Indirect Bilirubin: 0.5 mg/dL (ref 0.3–0.9)
Total Bilirubin: 0.6 mg/dL (ref 0.3–1.2)
Total Protein: 7.3 g/dL (ref 6.5–8.1)

## 2020-12-11 LAB — I-STAT BETA HCG BLOOD, ED (MC, WL, AP ONLY): I-stat hCG, quantitative: <5 m[IU]/mL (ref <5)

## 2020-12-11 LAB — MAGNESIUM: Magnesium: 1.9 mg/dL (ref 1.7–2.4)

## 2020-12-11 LAB — TROPONIN I (HIGH SENSITIVITY)
Troponin I (High Sensitivity): 19 ng/L — ABNORMAL HIGH (ref <18)
Troponin I (High Sensitivity): 106 ng/L (ref <18)
Troponin I (High Sensitivity): 112 ng/L (ref <18)

## 2020-12-11 LAB — CBC
HCT: 46.1 % — ABNORMAL HIGH (ref 36.0–46.0)
Hemoglobin: 14.3 g/dL (ref 12.0–15.0)
MCH: 28.4 pg (ref 26.0–34.0)
MCHC: 31 g/dL (ref 30.0–36.0)
MCV: 91.7 fL (ref 80.0–100.0)
Platelets: 331 10*3/uL (ref 150–400)
RBC: 5.03 MIL/uL (ref 3.87–5.11)
RDW: 12.4 % (ref 11.5–15.5)
WBC: 7.9 10*3/uL (ref 4.0–10.5)
nRBC: 0 % (ref 0.0–0.2)

## 2020-12-11 LAB — TSH: TSH: 1.647 u[IU]/mL (ref 0.350–4.500)

## 2020-12-11 MED ORDER — DILTIAZEM LOAD VIA INFUSION
20.0000 mg | Freq: Once | INTRAVENOUS | Status: DC
  Filled 2020-12-11: qty 20

## 2020-12-11 MED ORDER — ADENOSINE 6 MG/2ML IV SOLN
6.0000 mg | Freq: Once | INTRAVENOUS | Status: AC
  Administered 2020-12-11: 09:00:00 6 mg via INTRAVENOUS
  Filled 2020-12-11: qty 2

## 2020-12-11 MED ORDER — DILTIAZEM HCL-DEXTROSE 125-5 MG/125ML-% IV SOLN (PREMIX): 5.0000 mg/h | INTRAVENOUS | Status: DC

## 2020-12-11 MED ORDER — ADENOSINE 6 MG/2ML IV SOLN
12.0000 mg | Freq: Once | INTRAVENOUS | Status: AC
  Administered 2020-12-11: 09:00:00 12 mg via INTRAVENOUS

## 2020-12-11 MED ORDER — DILTIAZEM HCL 30 MG PO TABS
30.0000 mg | ORAL_TABLET | Freq: Once | ORAL | Status: AC
  Filled 2020-12-11: qty 1

## 2020-12-11 NOTE — Discharge Instructions (Signed)
You were seen in the emergency department today with elevated heart rate.  You have gone back into SVT.  Please begin taking your diltiazem as prescribed as this will help to keep your heart rate down.  Please call your primary care doctor as well as your cardiologist tomorrow to schedule follow-up appointments.  Return to the emergency department any chest pain, return of heart palpitations, other severe symptoms.

## 2020-12-11 NOTE — ED Provider Notes (Signed)
Emergency Department Provider Note   I have reviewed the triage vital signs and the nursing notes.   HISTORY  Chief Complaint SVT   HPI Anita Hughes is a 60 y.o. female with PMH of GERD, HTN, and SVT presents to the ED with acute onset heart palpitations and SVT at home.  Patient estimates that she has had symptoms for approximately 1 hour prior to ED arrival.  She denies any chest pain.  The feeling of her heart racing causes some mild shortness of breath versus anxiety type symptoms.  She has not been taking her carvedilol stating that it makes her feel depressed and low energy.  She has recently been prescribed diltiazem 30 mg tablets but has not been taking these either because she tells me she hesitates when taking new medicines. No radiation of symptoms or modifying factors.    Past Medical History:  Diagnosis Date   GERD (gastroesophageal reflux disease)    Hypertension     Patient Active Problem List   Diagnosis Date Noted   Viral URI with cough 02/07/2018   Supraventricular tachycardia ([place]) 11/28/2017   Hyperlipidemia 05/08/2017   Pre-diabetes 11/15/2015   Health care maintenance 11/15/2015   Hypertension    Obesity 03/09/2008   ESOPHAGEAL STRICTURE 03/09/2008   Hiatal hernia with gastroesophageal reflux 03/09/2008    Past Surgical History:  Procedure Laterality Date   ABDOMINAL HYSTERECTOMY  2001   [place]    Allergies Patient has no known allergies.  Family History  Problem Relation Age of Onset   Hypertension Mother    Heart attack Father     Social History Social History   Tobacco Use   Smoking status: Never Smoker   Smokeless tobacco: Never Used  Scientific laboratory technician Use: Never used  Substance Use Topics   Alcohol use: No    Alcohol/week: 0.0 standard drinks   Drug use: No    Review of Systems  Constitutional: No fever/chills Eyes: No visual changes. ENT: No sore  throat. Cardiovascular: Denies chest pain. Positive heart palpitations.  Respiratory: Mild shortness of breath. Gastrointestinal: No abdominal pain.  No nausea, no vomiting.  No diarrhea.  No constipation. Genitourinary: Negative for dysuria. Musculoskeletal: Negative for back pain. Skin: Negative for rash. Neurological: Negative for headaches, focal weakness or numbness.  10-point ROS otherwise negative.  ____________________________________________   PHYSICAL EXAM:  VITAL SIGNS: Vitals:   12/11/20 1500 12/11/20 1542  BP: (!) 161/91   Pulse: 76   Resp: 19   Temp: 98.6 F (37 C) 98.7 F (37.1 C)  SpO2: 99%      Constitutional: Alert and oriented. Appears slightly uncomfortable.  Eyes: Conjunctivae are normal.  Head: Atraumatic. Nose: No congestion/rhinnorhea. Mouth/Throat: Mucous membranes are moist.  Neck: No stridor.   Cardiovascular: SVT. Good peripheral circulation. Grossly normal heart sounds.   Respiratory: Normal respiratory effort.  No retractions. Lungs CTAB. Gastrointestinal: Soft and nontender. No distention.  Musculoskeletal: No lower extremity tenderness nor edema. No gross deformities of extremities. Neurologic:  Normal speech and language. No gross focal neurologic deficits are appreciated.  Skin:  Skin is warm, dry and intact. No rash noted.   ____________________________________________   LABS (all labs ordered are listed, but only abnormal results are displayed)  Labs Reviewed  BASIC METABOLIC PANEL - Abnormal; Notable for the following components:      Result Value   CO2 19 (*)    Glucose, Bld 166 (*)  All other components within normal limits  CBC - Abnormal; Notable for the following components:   HCT 46.1 (*)    All other components within normal limits  HEPATIC FUNCTION PANEL - Abnormal; Notable for the following components:   AST 78 (*)    ALT 60 (*)    All other components within normal limits  TROPONIN I (HIGH SENSITIVITY) -  Abnormal; Notable for the following components:   Troponin I (High Sensitivity) 19 (*)    All other components within normal limits  TROPONIN I (HIGH SENSITIVITY) - Abnormal; Notable for the following components:   Troponin I (High Sensitivity) 106 (*)    All other components within normal limits  TROPONIN I (HIGH SENSITIVITY) - Abnormal; Notable for the following components:   Troponin I (High Sensitivity) 112 (*)    All other components within normal limits  TSH  MAGNESIUM  I-STAT BETA HCG BLOOD, ED ([facility], [facility], [facility] ONLY)   ____________________________________________  EKG  EKG 1:   EKG Interpretation  Date/Time:  Sunday December 11 2020 08:50:17 EST Ventricular Rate:  186 PR Interval:    QRS Duration: 90 QT Interval:  254 QTC Calculation: 447 R Axis:   30 Text Interpretation: Supraventricular tachycardia Nonspecific ST abnormality Abnormal ECG Confirmed by Nanda Quinton [phone]) on 12/11/2020 9:31:28 AM      EKG 2:   EKG Interpretation  Date/Time:  Sunday December 11 2020 08:50:17 EST Ventricular Rate:  186 PR Interval:    QRS Duration: 90 QT Interval:  254 QTC Calculation: 447 R Axis:   30 Text Interpretation: Supraventricular tachycardia Nonspecific ST abnormality Abnormal ECG Confirmed by Nanda Quinton [phone]) on 12/11/2020 9:31:28 AM       ____________________________________________  RADIOLOGY  None  ____________________________________________   PROCEDURES  Procedure(s) performed:   .Critical Care Performed by: Margette Fast, MD Authorized by: Margette Fast, MD   Critical care provider statement:    Critical care time (minutes):  35   Critical care time was exclusive of:  Separately billable procedures and treating other patients and teaching time   Critical care was necessary to treat or prevent imminent or life-threatening deterioration of the following conditions:  Circulatory failure   Critical care was time spent personally by me on the  following activities:  Discussions with consultants, evaluation of patient's response to treatment, examination of patient, ordering and performing treatments and interventions, ordering and review of laboratory studies, ordering and review of radiographic studies, pulse oximetry, re-evaluation of patient's condition, obtaining history from patient or surrogate, review of old charts, blood draw for specimens and development of treatment plan with patient or surrogate   I assumed direction of critical care for this patient from another provider in my specialty: no   .Cardioversion  Date/Time: 12/12/2020 8:16 AM Performed by: Margette Fast, MD Authorized by: Margette Fast, MD   Consent:    Consent obtained:  Emergent situation and verbal   Consent given by:  Patient   Risks discussed:  Death, induced arrhythmia and pain   Alternatives discussed:  Rate-control medication Pre-procedure details:    Cardioversion basis:  Emergent   Rhythm:  Supraventricular tachycardia Patient sedated: No Attempt one:    Cardioversion mode attempt one: Adenosine 6 mg push.   Shock outcome:  No change in rhythm Attempt two:    Cardioversion mode attempt two: Adenosine 12 mg puch.   Shock outcome:  No change in rhythm (Pause with NSR but then return to SVT. No flutter waves.)  Post-procedure details:    Patient status:  Awake   Patient tolerance of procedure:  Tolerated well, no immediate complications     ____________________________________________   INITIAL IMPRESSION / ASSESSMENT AND PLAN / ED COURSE  Pertinent labs & imaging results that were available during my care of the patient were reviewed by me and considered in my medical decision making (see chart for details).   Patient arrives to the emergency department in SVT seen on her initial EKG.  Blood pressures are normal.  Mental status is normal.  She has been noncompliant with her rate control medicines at home.  Discussed need for  cardioversion with the patient on an emergent basis given her rate in the 170s to 180s.  She gives verbal consent for adenosine trial which was given at 6 mg followed by 12 mg dose.  She had good pause with these doses but went back to SVT.  Shortly after her second adenosine dose she spontaneously converted to normal sinus rhythm and is feeling much improved.  Gave a dose of her oral diltiazem here and will follow labs. Doubt PE, ACS, or infectious etiology. No other vitals or exam findings to suspect thyroid storm.   03:00 PM Patient's labs are reassuring.  Her troponin up trended slightly which I would expect in the setting of SVT for nearly 1 hour.  After it leveled off patient continued to be in normal sinus rhythm and is ready for discharge.  She has never had any chest discomfort.  She will begin taking her oral diltiazem as prescribed earlier and follow closely with her cardiology team.  Discussed ED return precautions in detail. ____________________________________________  FINAL CLINICAL IMPRESSION(S) / ED DIAGNOSES  Final diagnoses:  SVT (supraventricular tachycardia) ([place])    MEDICATIONS GIVEN DURING THIS VISIT:  Medications  adenosine (ADENOCARD) 6 MG/2ML injection 6 mg (6 mg Intravenous Given 12/11/20 0918)  diltiazem (CARDIZEM) tablet 30 mg (0 mg Oral Return to [facility] 12/11/20 0932)  adenosine (ADENOCARD) 6 MG/2ML injection 12 mg (12 mg Intravenous Given 12/11/20 0920)    Note:  This document was prepared using Dragon voice recognition software and may include unintentional dictation errors.  Nanda Quinton, MD, [facility] Emergency Medicine    Mykeisha Dysert, [facility], MD 12/12/20 [phone]

## 2020-12-11 NOTE — ED Notes (Signed)
Pt converted to NSR.  MD notified.

## 2020-12-11 NOTE — ED Notes (Signed)
MD notified of elevated trop

## 2020-12-11 NOTE — ED Notes (Signed)
Pt ambulatory to the br.

## 2020-12-11 NOTE — ED Triage Notes (Signed)
C/o SVT x 1 hour.  Denies pain.  States she is unsure if she has SOB or just anxious.  History of SVT.

## 2020-12-11 NOTE — ED Notes (Signed)
Pt ambulated to bathroom and HR remained in 90's.

## 2021-02-02 ENCOUNTER — Other Ambulatory Visit: Payer: Self-pay

## 2021-02-02 ENCOUNTER — Ambulatory Visit (INDEPENDENT_AMBULATORY_CARE_PROVIDER_SITE_OTHER): Payer: 59 | Admitting: Internal Medicine

## 2021-02-02 ENCOUNTER — Encounter: Payer: Self-pay | Admitting: Internal Medicine

## 2021-02-02 VITALS — BP 126/74 | HR 87 | Ht 61.0 in | Wt 236.0 lb

## 2021-02-02 DIAGNOSIS — K219 Gastro-esophageal reflux disease without esophagitis: Secondary | ICD-10-CM | POA: Diagnosis not present

## 2021-02-02 DIAGNOSIS — R1013 Epigastric pain: Secondary | ICD-10-CM

## 2021-02-02 DIAGNOSIS — R7989 Other specified abnormal findings of blood chemistry: Secondary | ICD-10-CM

## 2021-02-02 DIAGNOSIS — Z1211 Encounter for screening for malignant neoplasm of colon: Secondary | ICD-10-CM

## 2021-02-02 MED ORDER — PLENVU 140 G PO SOLR: 1.0000 | Freq: Once | ORAL | 0 refills | Status: AC

## 2021-02-02 NOTE — Patient Instructions (Signed)
If you are age 61 or older, your body mass index should be between 23-30. Your Body mass index is 44.59 kg/m. If this is out of the aforementioned range listed, please consider follow up with your Primary Care Provider.  If you are age 71 or younger, your body mass index should be between 19-25. Your Body mass index is 44.59 kg/m. If this is out of the aformentioned range listed, please consider follow up with your Primary Care Provider.   You have been scheduled for an endoscopy and colonoscopy. Please follow the written instructions given to you at your visit today. Please pick up your prep supplies at the pharmacy within the next 1-3 days. If you use inhalers (even only as needed), please bring them with you on the day of your procedure.

## 2021-02-02 NOTE — Progress Notes (Signed)
HISTORY OF PRESENT ILLNESS:  Anita Hughes is a 61 y.o. 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  She is sent today regarding GERD, dyspeptic symptoms, and the need for colonoscopy.  The patient was last seen December 2011 when she underwent routine screening colonoscopy.  She was found to have moderate left-sided diverticulosis, internal hemorrhoids, and a diminutive colon polyp which was removed and found to be hyperplastic.  Follow-up noted.  Patient tells me that she has had 2 episodes of significant SVT for which she was treated in the emergency room on 2 occasions over the past few years.  With adenosine.  Has seen cardiology.  Now on diltiazem.  She does have a history of pyrosis which is managed with pantoprazole.  She does report problems with excessive belching after meals.  She does require carbonated beverages to assist with belching.  This relieves her symptoms.  She does not have true dysphagia.  She does describe a sensation of fullness or food "sitting there".  She denies abdominal pain.  Her bowel habits are regular and unchanged.  Her weight has been stable.  No family history of colon cancer.  Review of outside laboratories from December 11, 2020 find elevated transaminases with AST 78 ALT 60.  Normal CBC with hemoglobin 14.3.  Normal platelets.  Normal TSH.  Her BMI is 45.6.  He has completed her COVID vaccination series and booster.  REVIEW OF SYSTEMS:  All non-GI ROS negative unless otherwise stated in the HPI except for anxiety, tachycardia  Past Medical History:  Diagnosis Date  . Colon polyps   . GERD (gastroesophageal reflux disease)   . Hemorrhoids   . Hiatal hernia   . Hyperlipidemia   . Hypertension   . Obesity   . SVT (supraventricular tachycardia) ([facility])     Past Surgical History:  Procedure Laterality Date  . ABDOMINAL HYSTERECTOMY  2001   FIBROIDS  . [place]    Social History Anita Hughes  reports that she has never smoked. She has never used smokeless tobacco. She reports that she does not drink alcohol and does not use drugs.  family history includes Heart attack in her father; Hypertension in her mother.  No Known Allergies     PHYSICAL EXAMINATION: Vital signs: BP 126/74   Pulse 87   Ht 5\' 1"  (1.549 m)   Wt 236 lb (107 kg)   LMP 03/21/2000   BMI 44.59 kg/m   Constitutional: Pleasant, generally well-appearing, no acute distress Psychiatric: alert and oriented x3, cooperative Eyes: extraocular movements intact, anicteric, conjunctiva pink Mouth: oral pharynx moist, no lesions Neck: supple no lymphadenopathy Cardiovascular: heart regular rate and rhythm, no murmur Lungs: clear to auscultation bilaterally Abdomen: soft, obese, nontender, nondistended, no obvious ascites, no peritoneal signs, normal bowel sounds, no organomegaly Rectal: Deferred until colonoscopy Extremities: no clubbing, cyanosis, or lower extremity edema bilaterally Skin: no lesions on visible extremities Neuro: No focal deficits. No asterixis.    ASSESSMENT:  1.  GERD.  Classic symptoms controlled with pantoprazole 40 mg daily 2.  Dyspepsia.  Consistent with gas bloat.  Relieved with carbonated beverage induced belching 3.  Colon cancer screening.  Previous colonoscopy 2011 with diverticulosis and hemorrhoids.  Due for follow-up 4.  Obesity 5.  Elevated hepatic transaminases.  Suspect fatty liver.  Needs work-up and monitored 6.  History of SVT  PLAN:  1.  Reflux precautions 2.  Weight loss  and exercise 3.  Upper endoscopy to evaluate chronic GERD and dyspeptic symptoms.The nature of the procedure, as well as the risks, benefits, and alternatives were carefully and thoroughly reviewed with the patient. Ample time for discussion and questions allowed. The patient understood, was satisfied, and agreed to proceed. 4.  Screening  colonoscopy.The nature of the procedure, as well as the risks, benefits, and alternatives were carefully and thoroughly reviewed with the patient. Ample time for discussion and questions allowed. The patient understood, was satisfied, and agreed to proceed. 5.  Further work-up of liver tests and follow-up to be determined

## 2021-02-17 ENCOUNTER — Encounter: Payer: 59 | Admitting: Internal Medicine

## 2021-03-20 ENCOUNTER — Ambulatory Visit (AMBULATORY_SURGERY_CENTER): Payer: 59 | Admitting: Internal Medicine

## 2021-03-20 ENCOUNTER — Encounter: Payer: Self-pay | Admitting: Internal Medicine

## 2021-03-20 ENCOUNTER — Other Ambulatory Visit (INDEPENDENT_AMBULATORY_CARE_PROVIDER_SITE_OTHER): Payer: 59

## 2021-03-20 ENCOUNTER — Other Ambulatory Visit: Payer: Self-pay

## 2021-03-20 VITALS — BP 150/86 | HR 86 | Temp 98.2°F | Resp 16 | Ht 61.0 in | Wt 236.0 lb

## 2021-03-20 DIAGNOSIS — K449 Diaphragmatic hernia without obstruction or gangrene: Secondary | ICD-10-CM | POA: Diagnosis not present

## 2021-03-20 DIAGNOSIS — K219 Gastro-esophageal reflux disease without esophagitis: Secondary | ICD-10-CM | POA: Diagnosis not present

## 2021-03-20 DIAGNOSIS — Z1211 Encounter for screening for malignant neoplasm of colon: Secondary | ICD-10-CM | POA: Diagnosis not present

## 2021-03-20 DIAGNOSIS — R7989 Other specified abnormal findings of blood chemistry: Secondary | ICD-10-CM | POA: Diagnosis not present

## 2021-03-20 LAB — HEPATIC FUNCTION PANEL
ALT: 20 U/L (ref 0–35)
AST: 25 U/L (ref 0–37)
Albumin: 4.4 g/dL (ref 3.5–5.2)
Alkaline Phosphatase: 68 U/L (ref 39–117)
Bilirubin, Direct: 0.1 mg/dL (ref 0.0–0.3)
Total Bilirubin: 0.4 mg/dL (ref 0.2–1.2)
Total Protein: 7.9 g/dL (ref 6.0–8.3)

## 2021-03-20 MED ORDER — SODIUM CHLORIDE 0.9 % IV SOLN: 500.0000 mL | Freq: Once | INTRAVENOUS | Status: DC

## 2021-03-20 NOTE — Progress Notes (Signed)
Pt's states no medical or surgical changes since previsit or office visit. 

## 2021-03-20 NOTE — Op Note (Signed)
[place] Patient Name: Anita Hughes Procedure Date: 03/20/2021 2:05 PM MRN: [id number] Endoscopist: Docia Chuck. Henrene Pastor , MD Age: 61 Referring MD:  Date of Birth: May 31, 1960 Gender: Female Account #: [id number] Procedure:                Colonoscopy Indications:              Screening for colorectal malignant neoplasm.                            Negative index exam December 2011 Medicines:                Monitored Anesthesia Care Procedure:                Pre-Anesthesia Assessment:                           - Prior to the procedure, a History and Physical                            was performed, and patient medications and                            allergies were reviewed. The patient's tolerance of                            previous anesthesia was also reviewed. The risks                            and benefits of the procedure and the sedation                            options and risks were discussed with the patient.                            All questions were answered, and informed consent                            was obtained. Prior Anticoagulants: The patient has                            taken no previous anticoagulant or antiplatelet                            agents. ASA Grade Assessment: II - A patient with                            mild systemic disease. After reviewing the risks                            and benefits, the patient was deemed in                            satisfactory condition to undergo the procedure.  After obtaining informed consent, the colonoscope                            was passed under direct vision. Throughout the                            procedure, the patient's blood pressure, pulse, and                            oxygen saturations were monitored continuously. The                            Olympus [id number]) Colonoscope was                            introduced through the anus and  advanced to the the                            cecum, identified by appendiceal orifice and                            ileocecal valve. The ileocecal valve, appendiceal                            orifice, and rectum were photographed. The quality                            of the bowel preparation was excellent. The                            colonoscopy was performed without difficulty. The                            patient tolerated the procedure well. The bowel                            preparation used was SUPREP via split dose                            instruction. Scope In: 2:15:52 PM Scope Out: 2:26:38 PM Scope Withdrawal Time: 0 hours 5 minutes 56 seconds  Total Procedure Duration: 0 hours 10 minutes 46 seconds  Findings:                 Multiple diverticula were found in the sigmoid                            colon.                           The exam was otherwise without abnormality on                            direct and retroflexion views. Complications:            No immediate complications. Estimated blood  loss:                            None. Estimated Blood Loss:     Estimated blood loss: none. Impression:               - Diverticulosis in the sigmoid colon.                           - The examination was otherwise normal on direct                            and retroflexion views.                           - No specimens collected. Recommendation:           - Repeat colonoscopy in 10 years for screening                            purposes.                           - Patient has a contact number available for                            emergencies. The signs and symptoms of potential                            delayed complications were discussed with the                            patient. Return to normal activities tomorrow.                            Written discharge instructions were provided to the                            patient.                            - Resume previous diet.                           - Continue present medications. Docia Chuck. Henrene Pastor, MD 03/20/2021 2:30:30 PM This report has been signed electronically.

## 2021-03-20 NOTE — Op Note (Signed)
[place] Patient Name: Anita Hughes Procedure Date: 03/20/2021 2:05 PM MRN: [id number] Endoscopist: Docia Chuck. Henrene Pastor , MD Age: 61 Referring MD:  Date of Birth: 1960/05/06 Gender: Female Account #: [id number] Procedure:                Upper GI endoscopy Indications:              Dyspepsia, Esophageal reflux Medicines:                Monitored Anesthesia Care Procedure:                Pre-Anesthesia Assessment:                           - Prior to the procedure, a History and Physical                            was performed, and patient medications and                            allergies were reviewed. The patient's tolerance of                            previous anesthesia was also reviewed. The risks                            and benefits of the procedure and the sedation                            options and risks were discussed with the patient.                            All questions were answered, and informed consent                            was obtained. Prior Anticoagulants: The patient has                            taken no previous anticoagulant or antiplatelet                            agents. ASA Grade Assessment: II - A patient with                            mild systemic disease. After reviewing the risks                            and benefits, the patient was deemed in                            satisfactory condition to undergo the procedure.                           After obtaining informed consent, the endoscope was  passed under direct vision. Throughout the                            procedure, the patient's blood pressure, pulse, and                            oxygen saturations were monitored continuously. The                            Endoscope was introduced through the mouth, and                            advanced to the second part of duodenum. The upper                            GI endoscopy was  accomplished without difficulty.                            The patient tolerated the procedure well. Scope In: Scope Out: Findings:                 The esophagus was normal.                           The stomach was normal save a sliding hiatal hernia                            and diminutive benign fundic gland type polyps.                           The examined duodenum was normal.                           The cardia and gastric fundus were normal on                            retroflexion. Complications:            No immediate complications. Estimated Blood Loss:     Estimated blood loss: none. Impression:               1. GERD                           2. Unremarkable EGD                           3. Mildly elevated liver test (hepatic transaminases Recommendation:           1. Reflux precautions                           2. Exercise and weight loss                           3. Continue current medications  4. Please send the patient to the laboratory today                            for LFTs (hepatic function panel). If liver tests                            remain elevated, then we will have you come back to                            the office for additional evaluation and work-up. Docia Chuck. Henrene Pastor, MD 03/20/2021 2:38:43 PM This report has been signed electronically.

## 2021-03-20 NOTE — Progress Notes (Signed)
VS by EW  

## 2021-03-20 NOTE — Patient Instructions (Signed)
YOU HAD AN ENDOSCOPIC PROCEDURE TODAY AT THE [facility]:   Refer to the procedure report that was given to you for any specific questions about what was found during the examination.  If the procedure report does not answer your questions, please call your gastroenterologist to clarify.  If you requested that your care partner not be given the details of your procedure findings, then the procedure report has been included in a sealed envelope for you to review at your convenience later.  YOU SHOULD EXPECT: Some feelings of bloating in the abdomen. Passage of more gas than usual.  Walking can help get rid of the air that was put into your GI tract during the procedure and reduce the bloating. If you had a lower endoscopy (such as a colonoscopy or flexible sigmoidoscopy) you may notice spotting of blood in your stool or on the toilet paper. If you underwent a bowel prep for your procedure, you may not have a normal bowel movement for a few days.  Please Note:  You might notice some irritation and congestion in your nose or some drainage.  This is from the oxygen used during your procedure.  There is no need for concern and it should clear up in a day or so.  SYMPTOMS TO REPORT IMMEDIATELY:   Following lower endoscopy (colonoscopy or flexible sigmoidoscopy):  Excessive amounts of blood in the stool  Significant tenderness or worsening of abdominal pains  Swelling of the abdomen that is new, acute  Fever of 100F or higher   Following upper endoscopy (EGD)  Vomiting of blood or coffee ground material  New chest pain or pain under the shoulder blades  Painful or persistently difficult swallowing  New shortness of breath  Fever of 100F or higher  Black, tarry-looking stools  For urgent or emergent issues, a gastroenterologist can be reached at any hour by calling [phone]. Do not use MyChart messaging for urgent concerns.    DIET:  We do recommend a small meal at first, but  then you may proceed to your regular diet.  Drink plenty of fluids but you should avoid alcoholic beverages for 24 hours.  ACTIVITY:  You should plan to take it easy for the rest of today and you should NOT DRIVE or use heavy machinery until tomorrow (because of the sedation medicines used during the test).    FOLLOW UP: Our staff will call the number listed on your records 48-72 hours following your procedure to check on you and address any questions or concerns that you may have regarding the information given to you following your procedure. If we do not reach you, we will leave a message.  We will attempt to reach you two times.  During this call, we will ask if you have developed any symptoms of COVID 19. If you develop any symptoms (ie: fever, flu-like symptoms, shortness of breath, cough etc.) before then, please call [phone].  If you test positive for Covid 19 in the 2 weeks post procedure, please call and report this information to us.    If any biopsies were taken you will be contacted by phone or by letter within the next 1-3 weeks.  Please call us at [phone] if you have not heard about the biopsies in 3 weeks.    SIGNATURES/CONFIDENTIALITY: You and/or your care partner have signed paperwork which will be entered into your electronic medical record.  These signatures attest to the fact that that the information above on   your After Visit Summary has been reviewed and is understood.  Full responsibility of the confidentiality of this discharge information lies with you and/or your care-partner. 

## 2021-03-20 NOTE — Progress Notes (Signed)
pt tolerated well. VSS. awake and to recovery. Report given to RN. ite block left insitu to recovery.

## 2021-03-20 NOTE — Progress Notes (Signed)
E.W. vital signs.

## 2021-03-22 ENCOUNTER — Telehealth: Payer: Self-pay

## 2021-03-22 NOTE — Telephone Encounter (Signed)
  Follow up Call-  Call back number 03/20/2021  Post procedure Call Back phone  # [phone]  Permission to leave phone message Yes  Some recent data might be hidden     Patient questions:  Do you have a fever, pain , or abdominal swelling? No. Pain Score  0 *  Have you tolerated food without any problems? Yes.    Have you been able to return to your normal activities? Yes.    Do you have any questions about your discharge instructions: Diet   No. Medications  No. Follow up visit  No.  Do you have questions or concerns about your Care? No.  Actions: * If pain score is 4 or above: No action needed, pain <4.   1. Have you developed a fever since your procedure? No   2.   Have you had an respiratory symptoms (SOB or cough) since your procedure? No   3.   Have you tested positive for COVID 19 since your procedure? No   4.   Have you had any family members/close contacts diagnosed with the COVID 19 since your procedure?  No    If yes to any of these questions please route to Joylene John, RN and Joella Prince, RN

## 2021-10-03 ENCOUNTER — Other Ambulatory Visit: Payer: Self-pay

## 2021-10-03 ENCOUNTER — Encounter: Payer: Self-pay | Admitting: Cardiology

## 2021-10-03 ENCOUNTER — Ambulatory Visit: Payer: 59 | Admitting: Cardiology

## 2021-10-03 VITALS — BP 181/88 | HR 82 | Temp 97.3°F | Resp 17 | Ht 61.0 in | Wt 236.0 lb

## 2021-10-03 DIAGNOSIS — I10 Essential (primary) hypertension: Secondary | ICD-10-CM

## 2021-10-03 DIAGNOSIS — E7800 Pure hypercholesterolemia, unspecified: Secondary | ICD-10-CM

## 2021-10-03 DIAGNOSIS — R7303 Prediabetes: Secondary | ICD-10-CM

## 2021-10-03 DIAGNOSIS — I471 Supraventricular tachycardia, unspecified: Secondary | ICD-10-CM

## 2021-10-03 MED ORDER — DILTIAZEM HCL ER COATED BEADS 240 MG PO CP24
240.0000 mg | ORAL_CAPSULE | Freq: Every day | ORAL | 2 refills | Status: DC
Start: 2021-10-03 — End: 2021-12-29

## 2021-10-03 MED ORDER — EZETIMIBE-SIMVASTATIN 10-20 MG PO TABS: 1.0000 | ORAL_TABLET | Freq: Every day | ORAL | 2 refills | Status: DC

## 2021-10-03 NOTE — Progress Notes (Signed)
Primary Physician/Referring:  Aura Dials, PA-C  Patient ID: Anita Hughes, female    DOB: 11-10-1960, 61 y.o.   MRN: [id number]  Chief Complaint  Patient presents with   SVT   New Patient (Initial Visit)    Referred by Roe Coombs, PA   HPI:    Anita Hughes  is a 61 y.o. African-American female patient with history of PSVT first diagnosed in 2018, mild OSA not on CPAP, prediabetes mellitus, hypertension, hyperlipidemia, history of GERD, hiatal hernia referred to me for evaluation of PSVT.   She had been doing well until summer 2022 during episodes of extreme stress at work, started having recurrence of symptoms suggestive of PSVT.  She is now referred for further cardiac evaluation, previously seen by [facility] health cardiology.  Patient has been having at least 3 episodes of SVT necessitating ED visit, last ER visit was in December 2022 at [facility] health.  She now presents to establish care with me.  Presently not having any specific symptom.  Past Medical History:  Diagnosis Date   Colon polyps    GERD (gastroesophageal reflux disease)    Hemorrhoids    Hiatal hernia    Hyperlipidemia    Hypertension    Obesity    SVT (supraventricular tachycardia) ([place])    Past Surgical History:  Procedure Laterality Date   ABDOMINAL HYSTERECTOMY  2001   FIBROIDS   CESAREAN SECTION  1990   Family History  Problem Relation Age of Onset   Hypertension Mother    Heart attack Father 30       2 heart attacks   Hypertension Sister    Hypertension Sister    Hypertension Sister    Hypertension Brother     Social History   Tobacco Use   Smoking status: Never   Smokeless tobacco: Never  Substance Use Topics   Alcohol use: No    Alcohol/week: 0.0 standard drinks   Marital Status: Married  ROS  Review of Systems  Cardiovascular:  Positive for palpitations. Negative for chest pain, dyspnea on exertion and leg swelling.  Respiratory:  Positive for snoring.    Gastrointestinal:  Negative for melena.  Objective  Blood pressure (!) 181/88, pulse 82, temperature (!) 97.3 F (36.3 C), temperature source Temporal, resp. rate 17, height 5\' 1"  (1.549 m), weight 236 lb (107 kg), last menstrual period 03/21/2000, SpO2 99 %. Body mass index is 44.59 kg/m.  Vitals with BMI 10/03/2021 10/03/2021 03/20/2021  Height - 5\' 1"  -  Weight - 236 lbs -  BMI - 85.46 -  Systolic 270 350 093  Diastolic 88 67 86  Pulse 82 81 86    Physical Exam Constitutional:      Appearance: She is morbidly obese.  Neck:     Vascular: No carotid bruit or JVD.  Cardiovascular:     Rate and Rhythm: Normal rate and regular rhythm.     Pulses: Intact distal pulses.     Heart sounds: Normal heart sounds. No murmur heard.   No gallop.  Pulmonary:     Effort: Pulmonary effort is normal.     Breath sounds: Normal breath sounds.  Abdominal:     General: Bowel sounds are normal.     Palpations: Abdomen is soft.  Musculoskeletal:        General: No swelling.     Laboratory examination:   Recent Labs    12/11/20 0911  NA 138  K 4.0  CL 106  CO2 19*  GLUCOSE 166*  BUN 11  CREATININE 0.86  CALCIUM 9.1  GFRNONAA >60   CrCl cannot be calculated (Patient's most recent lab result is older than the maximum 21 days allowed.).  CMP Latest Ref Rng & Units 03/20/2021 12/11/2020 05/09/2018  Glucose 70 - 99 mg/dL - 166(H) 107(H)  BUN 6 - 20 mg/dL - 11 13  Creatinine 0.44 - 1.00 mg/dL - 0.86 0.76  Sodium 135 - 145 mmol/L - 138 140  Potassium 3.5 - 5.1 mmol/L - 4.0 3.8  Chloride 98 - 111 mmol/L - 106 101  CO2 22 - 32 mmol/L - 19(L) 24  Calcium 8.9 - 10.3 mg/dL - 9.1 9.5  Total Protein 6.0 - 8.3 g/dL 7.9 7.3 6.6  Total Bilirubin 0.2 - 1.2 mg/dL 0.4 0.6 0.3  Alkaline Phos 39 - 117 U/L 68 69 70  AST 0 - 37 U/L 25 78(H) 15  ALT 0 - 35 U/L 20 60(H) 17   CBC Latest Ref Rng & Units 12/11/2020 05/09/2018 11/02/2017  WBC 4.0 - 10.5 K/uL 7.9 6.0 7.4  Hemoglobin 12.0 - 15.0 g/dL 14.3 13.5  14.3  Hematocrit 36.0 - 46.0 % 46.1(H) 38.7 44.1  Platelets 150 - 400 K/uL 331 305 327    Lipid Panel No results for input(s): CHOL, TRIG, LDLCALC, VLDL, HDL, CHOLHDL, LDLDIRECT in the last 8760 hours. Lipid Panel     Component Value Date/Time   CHOL 212 (H) 05/09/2018 0719   TRIG 140 05/09/2018 0719   HDL 48 05/09/2018 0719   CHOLHDL 4.4 05/09/2018 0719   CHOLHDL 4.9 03/21/2012 1528   VLDL 20 03/21/2012 1528   LDLCALC 136 (H) 05/09/2018 0719   LABVLDL 28 05/09/2018 0719     HEMOGLOBIN A1C Lab Results  Component Value Date   HGBA1C 6.1 (H) 05/09/2018   TSH Recent Labs    12/11/20 0911  TSH 1.647    External labs:   Labs 06/27/2021:  Sodium 141, K4.5, creatinine 0.99, GFR 65, LFTs normal,  LDL 199, HDL 56, TG 112, TC 275,  HgbA1c 6%, TSH 1.4.  Medications and allergies   Allergies  Allergen Reactions   Pravastatin Other (See Comments)    Shortness of breath   Rosuvastatin Other (See Comments)    Patient wakes up with short breaths/hard to catch breaths when taking this med.      Medication prior to this encounter:   Outpatient Medications Prior to Visit  Medication Sig Dispense Refill   diltiazem (CARDIZEM) 30 MG tablet Take 30 mg by mouth 3 (three) times daily as needed. 1-2 tablets For PSVT     FLUoxetine (PROZAC) 20 MG capsule Take 20 mg by mouth daily.     pantoprazole (PROTONIX) 40 MG tablet Take 40 mg by mouth daily.     No facility-administered medications prior to visit.     Medication list after today's encounter   Current Outpatient Medications  Medication Instructions   diltiazem (CARDIZEM CD) 240 mg, Oral, Daily   diltiazem (CARDIZEM) 30 mg, Oral, 3 times daily PRN, 1-2 tablets For PSVT   ezetimibe-simvastatin (VYTORIN) 10-20 MG tablet 1 tablet, Oral, Daily-1800   FLUoxetine (PROZAC) 20 mg, Oral, Daily   pantoprazole (PROTONIX) 40 mg, Oral, Daily    Radiology:   No results found.  Cardiac Studies:   Echocardiogram 05/16/2018:  -  Left ventricle: The cavity size was normal. There was mild focal   basal hypertrophy of the septum. Systolic function was normal.   The estimated ejection fraction was in  the range of 55% to 60%.   Wall motion was normal; there were no regional wall motion   abnormalities. Doppler parameters are consistent with abnormal   left ventricular relaxation (grade 1 diastolic dysfunction).  Stress echo from Feb 05, 2020  LV EF: 55-60%. Grade 1 diastolic dysfunction  Stress: Normal functional capacity - 7.2 mets.  Stress ECG: ECG - No ischemic ST segment changes occurred with stress.  No evidence of inducible ischemia at HR achieved. normal LV fxn and no signif valve dz.  EKG:   EKG 10/03/2021: Normal sinus rhythm at rate of 74 bpm, normal axis.  No evidence of ischemia, normal EKG.    EKG 12/11/2020: SVT at the rate of 171 bpm.  No evidence of ischemia.  Assessment     ICD-10-CM   1. Supraventricular tachycardia (HCC)  I47.1 EKG 12-Lead    diltiazem (CARDIZEM CD) 240 MG 24 hr capsule    2. Pure hypercholesterolemia  E78.00 ezetimibe-simvastatin (VYTORIN) 10-20 MG tablet    Lipid Panel With LDL/HDL Ratio    Lipid Panel With LDL/HDL Ratio    3. Primary hypertension  I10 diltiazem (CARDIZEM CD) 240 MG 24 hr capsule    4. Pre-diabetes  R73.03        There are no discontinued medications.  Meds ordered this encounter  Medications   diltiazem (CARDIZEM CD) 240 MG 24 hr capsule    Sig: Take 1 capsule (240 mg total) by mouth daily.    Dispense:  30 capsule    Refill:  2   ezetimibe-simvastatin (VYTORIN) 10-20 MG tablet    Sig: Take 1 tablet by mouth daily at 6 PM.    Dispense:  30 tablet    Refill:  2    Orders Placed This Encounter  Procedures   Lipid Panel With LDL/HDL Ratio    Standing Status:   Future    Number of Occurrences:   1    Standing Expiration Date:   10/03/2022   EKG 12-Lead   Recommendations:   Anita Hughes is a 61 y.o. African-American female patient with  history of PSVT first diagnosed in 2018, mild OSA not on CPAP, prediabetes mellitus, hypertension, hyperlipidemia, history of GERD, hiatal hernia referred to me for evaluation of PSVT.   She had been doing well until summer 2022 during episodes of extreme stress at work, started having recurrence of symptoms suggestive of PSVT.  She is now referred for further cardiac evaluation, previously seen by [facility] health cardiology.  Patient has been having at least 3 episodes of SVT necessitating ED visit, last ER visit was in December 2022 at [facility] health.  I evaluated her EKG, she had classic PSVT, fortunately in spite of heart rate being markedly elevated, she did not have any ST-T wave changes of ischemia.  I also evaluated her previous records including echocardiogram and stress echocardiogram, I reassured her with regard to CAD.  She is not too keen on having ablation.  She is also being very selective with taking medications, after discussion she is willing to change to long-acting diltiazem CD in  240 mg daily, she will continue with diltiazem 30 mg plain tablets on a as needed basis for PSVT episodes, hopefully this will also improve her blood pressure.  I also discussed with her that she is at extreme high risk for development of atrial fibrillation in view of morbid obesity, mild obstructive sleep apnea, hypertension and PSVT potentially inducing atrial fibrillation.  She also has prediabetes mellitus.  Weight loss discussed extensively.  With regard to hyperlipidemia, she did not tolerate pravastatin or Crestor in the past but I would like to try low-dose of Vytorin 10/20 mg in the evening and obtain a repeat lipid profile in 2 months and I would like to see her back in 3 months for follow-up.    Adrian Prows, MD, [facility] 10/03/2021, 4:16 PM Office: [phone]

## 2021-12-29 ENCOUNTER — Telehealth: Payer: Self-pay

## 2021-12-29 DIAGNOSIS — I10 Essential (primary) hypertension: Secondary | ICD-10-CM

## 2021-12-29 DIAGNOSIS — E7800 Pure hypercholesterolemia, unspecified: Secondary | ICD-10-CM

## 2021-12-29 DIAGNOSIS — I471 Supraventricular tachycardia: Secondary | ICD-10-CM

## 2021-12-29 MED ORDER — DILTIAZEM HCL ER COATED BEADS 240 MG PO CP24: 240.0000 mg | ORAL_CAPSULE | Freq: Every day | ORAL | 2 refills | Status: DC

## 2021-12-29 MED ORDER — EZETIMIBE-SIMVASTATIN 10-20 MG PO TABS: 1.0000 | ORAL_TABLET | Freq: Every day | ORAL | 2 refills | Status: DC

## 2022-01-03 ENCOUNTER — Ambulatory Visit: Payer: 59 | Admitting: Cardiology

## 2022-02-13 ENCOUNTER — Ambulatory Visit: Payer: Federal, State, Local not specified - PPO | Admitting: Cardiology

## 2022-03-08 ENCOUNTER — Other Ambulatory Visit: Payer: Self-pay | Admitting: Cardiology

## 2022-03-08 DIAGNOSIS — I471 Supraventricular tachycardia: Secondary | ICD-10-CM

## 2022-03-08 DIAGNOSIS — I10 Essential (primary) hypertension: Secondary | ICD-10-CM

## 2022-05-07 ENCOUNTER — Other Ambulatory Visit: Payer: Self-pay | Admitting: Obstetrics

## 2022-05-07 DIAGNOSIS — N6452 Nipple discharge: Secondary | ICD-10-CM

## 2022-05-22 ENCOUNTER — Ambulatory Visit
Admission: RE | Admit: 2022-05-22 | Discharge: 2022-05-22 | Disposition: A | Discharge status: Home / Self Care | Payer: Federal, State, Local not specified - PPO | Source: Ambulatory Visit | Attending: Obstetrics | Admitting: Obstetrics

## 2022-05-22 ENCOUNTER — Other Ambulatory Visit: Payer: Self-pay | Admitting: Obstetrics

## 2022-05-22 ENCOUNTER — Ambulatory Visit
Admission: RE | Admit: 2022-05-22 | Discharge: 2022-05-22 | Disposition: A | Discharge status: Home / Self Care | Payer: 59 | Source: Ambulatory Visit | Attending: Obstetrics | Admitting: Obstetrics

## 2022-05-22 DIAGNOSIS — N6452 Nipple discharge: Secondary | ICD-10-CM

## 2022-05-24 ENCOUNTER — Ambulatory Visit
Admission: RE | Admit: 2022-05-24 | Discharge: 2022-05-24 | Disposition: A | Discharge status: Home / Self Care | Payer: Federal, State, Local not specified - PPO | Source: Ambulatory Visit | Attending: Obstetrics | Admitting: Obstetrics

## 2022-05-24 ENCOUNTER — Other Ambulatory Visit: Payer: Self-pay | Admitting: Diagnostic Radiology

## 2022-05-24 DIAGNOSIS — N6452 Nipple discharge: Secondary | ICD-10-CM

## 2022-05-24 HISTORY — PX: BREAST BIOPSY: SHX20

## 2022-06-19 ENCOUNTER — Ambulatory Visit: Payer: Self-pay | Admitting: Surgery

## 2022-06-19 DIAGNOSIS — D242 Benign neoplasm of left breast: Secondary | ICD-10-CM

## 2022-06-22 ENCOUNTER — Other Ambulatory Visit: Payer: Self-pay | Admitting: Surgery

## 2022-06-22 DIAGNOSIS — D242 Benign neoplasm of left breast: Secondary | ICD-10-CM

## 2022-07-09 ENCOUNTER — Encounter: Payer: Self-pay | Admitting: Cardiology

## 2022-07-09 ENCOUNTER — Ambulatory Visit: Payer: Federal, State, Local not specified - PPO | Admitting: Cardiology

## 2022-07-09 DIAGNOSIS — E7800 Pure hypercholesterolemia, unspecified: Secondary | ICD-10-CM | POA: Insufficient documentation

## 2022-07-09 NOTE — Progress Notes (Deleted)
Primary Physician/Referring:  Aura Dials, PA-C  Patient ID: Anita Hughes, female    DOB: 27-Dec-1960, 62 y.o.   MRN: [id number]  No chief complaint on file.  HPI:    Anita Hughes  is a 62 y.o. African-American female patient with history of PSVT first diagnosed in 2018, mild OSA not on CPAP, prediabetes mellitus, hypertension, hyperlipidemia, history of GERD, hiatal hernia referred to me for evaluation of PSVT.   She had been doing well until summer 2022 during episodes of extreme stress at work, started having recurrence of symptoms suggestive of PSVT.  She is now referred for further cardiac evaluation, previously seen by [facility] health cardiology.  Patient has been having at least 3 episodes of SVT necessitating ED visit, last ER visit was in December 2022 at [facility].  She now presents to establish care with me.  Presently not having any specific symptom.  Past Medical History:  Diagnosis Date   Colon polyps    GERD (gastroesophageal reflux disease)    Hemorrhoids    Hiatal hernia    Obesity    SVT (supraventricular tachycardia) (HCC)    Past Surgical History:  Procedure Laterality Date   ABDOMINAL HYSTERECTOMY  2001   FIBROIDS   CESAREAN SECTION  1990   Family History  Problem Relation Age of Onset   Hypertension Mother    Heart attack Father 48       2 heart attacks   Hypertension Sister    Hypertension Sister    Hypertension Sister    Hypertension Brother     Social History   Tobacco Use   Smoking status: Never   Smokeless tobacco: Never  Substance Use Topics   Alcohol use: No    Alcohol/week: 0.0 standard drinks of alcohol   Marital Status: Married  ROS  Review of Systems  Cardiovascular:  Positive for palpitations. Negative for chest pain, dyspnea on exertion and leg swelling.  Respiratory:  Positive for snoring.   Gastrointestinal:  Negative for melena.   Objective  Last menstrual period 03/21/2000. There is no height or weight on  file to calculate BMI.     10/03/2021    3:29 PM 10/03/2021    3:28 PM 03/20/2021    3:01 PM  Vitals with BMI  Height  '5\' 1"'$    Weight  236 lbs   BMI  47.42   Systolic 595 638 756  Diastolic 88 67 86  Pulse 82 81 86    Physical Exam Constitutional:      Appearance: She is morbidly obese.  Neck:     Vascular: No carotid bruit or JVD.  Cardiovascular:     Rate and Rhythm: Normal rate and regular rhythm.     Pulses: Intact distal pulses.     Heart sounds: Normal heart sounds. No murmur heard.    No gallop.  Pulmonary:     Effort: Pulmonary effort is normal.     Breath sounds: Normal breath sounds.  Abdominal:     General: Bowel sounds are normal.     Palpations: Abdomen is soft.  Musculoskeletal:     Right lower leg: No edema.     Left lower leg: No edema.      Laboratory examination:  External labs:   Labs 06/27/2021:  Sodium 141, K4.5, creatinine 0.99, GFR 65, LFTs normal,  LDL 199, HDL 56, TG 112, TC 275,  HgbA1c 6%, TSH 1.4.  Medications and allergies   Allergies  Allergen Reactions  Pravastatin Other (See Comments)    Shortness of breath   Rosuvastatin Other (See Comments)    Patient wakes up with short breaths/hard to catch breaths when taking this med.     Medication list after today's encounter    Current Outpatient Medications:    diltiazem (CARDIZEM CD) 240 MG 24 hr capsule, TAKE 1 CAPSULE BY MOUTH EVERY DAY, Disp: 30 capsule, Rfl: 2   diltiazem (CARDIZEM) 30 MG tablet, Take 30 mg by mouth 3 (three) times daily as needed. 1-2 tablets For PSVT, Disp: , Rfl:    ezetimibe-simvastatin (VYTORIN) 10-20 MG tablet, Take 1 tablet by mouth daily at 6 PM., Disp: 30 tablet, Rfl: 2   FLUoxetine (PROZAC) 20 MG capsule, Take 20 mg by mouth daily., Disp: , Rfl:    pantoprazole (PROTONIX) 40 MG tablet, Take 40 mg by mouth daily., Disp: , Rfl:    Radiology:   No results found.  Cardiac Studies:   Echocardiogram 05/16/2018:  - Left ventricle: The cavity size was  normal. There was mild focal   basal hypertrophy of the septum. Systolic function was normal.   The estimated ejection fraction was in the range of 55% to 60%.   Wall motion was normal; there were no regional wall motion   abnormalities. Doppler parameters are consistent with abnormal   left ventricular relaxation (grade 1 diastolic dysfunction).  Stress echo from Feb 05, 2020  LV EF: 55-60%. Grade 1 diastolic dysfunction  Stress: Normal functional capacity - 7.2 mets.  Stress ECG: ECG - No ischemic ST segment changes occurred with stress.  No evidence of inducible ischemia at HR achieved. normal LV fxn and no signif valve dz.  EKG:   EKG 10/03/2021: Normal sinus rhythm at rate of 74 bpm, normal axis.  No evidence of ischemia, normal EKG.    EKG 12/11/2020: SVT at the rate of 171 bpm.  No evidence of ischemia.  Assessment     ICD-10-CM   1. Supraventricular tachycardia (HCC)  I47.1     2. Primary hypertension  I10     3. Pure hypercholesterolemia  E78.00        There are no discontinued medications.  No orders of the defined types were placed in this encounter.   No orders of the defined types were placed in this encounter.  Recommendations:   Anita Hughes is a 62 y.o. African-American female patient with history of PSVT first diagnosed in 2018, mild OSA not on CPAP, prediabetes mellitus, hypertension, hyperlipidemia, history of GERD, hiatal hernia referred to me for evaluation of PSVT.   She had been doing well until summer 2022 during episodes of extreme stress at work, started having recurrence of symptoms suggestive of PSVT.  She is now referred for further cardiac evaluation, previously seen by [facility] cardiology.  Patient has been having at least 3 episodes of SVT necessitating ED visit, last ER visit was in December 2022 at [facility].  I evaluated her EKG, she had classic PSVT, fortunately in spite of heart rate being markedly elevated, she did not have any ST-T  wave changes of ischemia.  I also evaluated her previous records including echocardiogram and stress echocardiogram, I reassured her with regard to CAD.  She is not too keen on having ablation.  She is also being very selective with taking medications, after discussion she is willing to change to long-acting diltiazem CD in  240 mg daily, she will continue with diltiazem 30 mg plain tablets on a as  needed basis for PSVT episodes, hopefully this will also improve her blood pressure.  I also discussed with her that she is at extreme high risk for development of atrial fibrillation in view of morbid obesity, mild obstructive sleep apnea, hypertension and PSVT potentially inducing atrial fibrillation.  She also has prediabetes mellitus.  Weight loss discussed extensively.  With regard to hyperlipidemia, she did not tolerate pravastatin or Crestor in the past but I would like to try low-dose of Vytorin 10/20 mg in the evening and obtain a repeat lipid profile in 2 months and I would like to see her back in 3 months for follow-up.    Adrian Prows, MD, [facility] 07/09/2022, 7:05 AM Office: [phone]

## 2022-07-23 ENCOUNTER — Encounter (HOSPITAL_BASED_OUTPATIENT_CLINIC_OR_DEPARTMENT_OTHER): Payer: Self-pay | Admitting: Surgery

## 2022-07-23 ENCOUNTER — Other Ambulatory Visit: Payer: Self-pay

## 2022-07-24 ENCOUNTER — Ambulatory Visit
Admission: RE | Admit: 2022-07-24 | Discharge: 2022-07-24 | Disposition: A | Discharge status: Home / Self Care | Payer: Federal, State, Local not specified - PPO | Source: Ambulatory Visit | Attending: Surgery | Admitting: Surgery

## 2022-07-24 DIAGNOSIS — D242 Benign neoplasm of left breast: Secondary | ICD-10-CM

## 2022-07-24 NOTE — Progress Notes (Signed)

## 2022-07-26 ENCOUNTER — Other Ambulatory Visit: Payer: Self-pay

## 2022-07-26 ENCOUNTER — Ambulatory Visit
Admission: RE | Admit: 2022-07-26 | Discharge: 2022-07-26 | Disposition: A | Discharge status: Home / Self Care | Payer: Federal, State, Local not specified - PPO | Source: Ambulatory Visit | Attending: Surgery | Admitting: Surgery

## 2022-07-26 ENCOUNTER — Ambulatory Visit (HOSPITAL_BASED_OUTPATIENT_CLINIC_OR_DEPARTMENT_OTHER): Payer: Federal, State, Local not specified - PPO | Admitting: Certified Registered Nurse Anesthetist

## 2022-07-26 ENCOUNTER — Encounter (HOSPITAL_BASED_OUTPATIENT_CLINIC_OR_DEPARTMENT_OTHER): Payer: Self-pay | Admitting: Surgery

## 2022-07-26 ENCOUNTER — Encounter (HOSPITAL_BASED_OUTPATIENT_CLINIC_OR_DEPARTMENT_OTHER)
Admission: RE | Disposition: A | Discharge status: Home / Self Care | Payer: Self-pay | Source: Home / Self Care | Attending: Surgery

## 2022-07-26 ENCOUNTER — Ambulatory Visit (HOSPITAL_BASED_OUTPATIENT_CLINIC_OR_DEPARTMENT_OTHER)
Admission: RE | Admit: 2022-07-26 | Discharge: 2022-07-26 | Disposition: A | Discharge status: Home / Self Care | Payer: Federal, State, Local not specified - PPO | Attending: Surgery | Admitting: Surgery

## 2022-07-26 DIAGNOSIS — I10 Essential (primary) hypertension: Secondary | ICD-10-CM | POA: Diagnosis not present

## 2022-07-26 DIAGNOSIS — K219 Gastro-esophageal reflux disease without esophagitis: Secondary | ICD-10-CM | POA: Insufficient documentation

## 2022-07-26 DIAGNOSIS — Z6841 Body Mass Index (BMI) 40.0 and over, adult: Secondary | ICD-10-CM | POA: Diagnosis not present

## 2022-07-26 DIAGNOSIS — Z8719 Personal history of other diseases of the digestive system: Secondary | ICD-10-CM | POA: Insufficient documentation

## 2022-07-26 DIAGNOSIS — D242 Benign neoplasm of left breast: Secondary | ICD-10-CM | POA: Insufficient documentation

## 2022-07-26 DIAGNOSIS — R7303 Prediabetes: Secondary | ICD-10-CM

## 2022-07-26 HISTORY — PX: BREAST LUMPECTOMY WITH RADIOACTIVE SEED LOCALIZATION: SHX6424

## 2022-07-26 SURGERY — BREAST LUMPECTOMY WITH RADIOACTIVE SEED LOCALIZATION
Anesthesia: General | Site: Breast | Laterality: Left

## 2022-07-26 MED ORDER — ACETAMINOPHEN 500 MG PO TABS
ORAL_TABLET | ORAL | Status: AC
Start: 2022-07-26 — End: ?
  Filled 2022-07-26: qty 2

## 2022-07-26 MED ORDER — MIDAZOLAM HCL 5 MG/5ML IJ SOLN
INTRAMUSCULAR | Status: DC | PRN
  Administered 2022-07-26: 2 mg via INTRAVENOUS

## 2022-07-26 MED ORDER — KETOROLAC TROMETHAMINE 30 MG/ML IJ SOLN
INTRAMUSCULAR | Status: AC
  Filled 2022-07-26: qty 1

## 2022-07-26 MED ORDER — FENTANYL CITRATE (PF) 100 MCG/2ML IJ SOLN: 25.0000 ug | INTRAMUSCULAR | Status: DC | PRN

## 2022-07-26 MED ORDER — OXYCODONE HCL 5 MG/5ML PO SOLN: 5.0000 mg | Freq: Once | ORAL | Status: DC | PRN

## 2022-07-26 MED ORDER — ONDANSETRON HCL 4 MG/2ML IJ SOLN: 4.0000 mg | Freq: Four times a day (QID) | INTRAMUSCULAR | Status: DC | PRN

## 2022-07-26 MED ORDER — LACTATED RINGERS IV SOLN: INTRAVENOUS | Status: DC

## 2022-07-26 MED ORDER — DEXAMETHASONE SODIUM PHOSPHATE 4 MG/ML IJ SOLN
INTRAMUSCULAR | Status: DC | PRN
  Administered 2022-07-26: 5 mg via INTRAVENOUS

## 2022-07-26 MED ORDER — DEXAMETHASONE SODIUM PHOSPHATE 10 MG/ML IJ SOLN
INTRAMUSCULAR | Status: AC
Start: 2022-07-26 — End: ?
  Filled 2022-07-26: qty 1

## 2022-07-26 MED ORDER — BUPIVACAINE-EPINEPHRINE (PF) 0.25% -1:200000 IJ SOLN
INTRAMUSCULAR | Status: DC | PRN
  Administered 2022-07-26: 10 mL via PERINEURAL

## 2022-07-26 MED ORDER — CEFAZOLIN SODIUM-DEXTROSE 2-4 GM/100ML-% IV SOLN
INTRAVENOUS | Status: AC
Start: 2022-07-26 — End: ?
  Filled 2022-07-26: qty 100

## 2022-07-26 MED ORDER — CHLORHEXIDINE GLUCONATE CLOTH 2 % EX PADS: 6.0000 | MEDICATED_PAD | Freq: Once | CUTANEOUS | Status: DC

## 2022-07-26 MED ORDER — ACETAMINOPHEN 500 MG PO TABS
1000.0000 mg | ORAL_TABLET | ORAL | Status: AC
  Administered 2022-07-26: 1000 mg via ORAL

## 2022-07-26 MED ORDER — MIDAZOLAM HCL 2 MG/2ML IJ SOLN
INTRAMUSCULAR | Status: AC
  Filled 2022-07-26: qty 2

## 2022-07-26 MED ORDER — ONDANSETRON HCL 4 MG/2ML IJ SOLN
INTRAMUSCULAR | Status: DC | PRN
  Administered 2022-07-26: 4 mg via INTRAVENOUS

## 2022-07-26 MED ORDER — ONDANSETRON HCL 4 MG/2ML IJ SOLN
INTRAMUSCULAR | Status: AC
  Filled 2022-07-26: qty 2

## 2022-07-26 MED ORDER — CEFAZOLIN SODIUM-DEXTROSE 2-4 GM/100ML-% IV SOLN
2.0000 g | INTRAVENOUS | Status: AC
  Administered 2022-07-26: 2 g via INTRAVENOUS

## 2022-07-26 MED ORDER — PROPOFOL 10 MG/ML IV BOLUS
INTRAVENOUS | Status: AC
  Filled 2022-07-26: qty 20

## 2022-07-26 MED ORDER — PROPOFOL 10 MG/ML IV BOLUS
INTRAVENOUS | Status: DC | PRN
  Administered 2022-07-26: 200 mg via INTRAVENOUS

## 2022-07-26 MED ORDER — FENTANYL CITRATE (PF) 100 MCG/2ML IJ SOLN
INTRAMUSCULAR | Status: AC
Start: 2022-07-26 — End: ?
  Filled 2022-07-26: qty 2

## 2022-07-26 MED ORDER — OXYCODONE HCL 5 MG PO TABS: 5.0000 mg | ORAL_TABLET | Freq: Once | ORAL | Status: DC | PRN

## 2022-07-26 MED ORDER — FENTANYL CITRATE (PF) 100 MCG/2ML IJ SOLN
INTRAMUSCULAR | Status: DC | PRN
  Administered 2022-07-26 (×2): 50 ug via INTRAVENOUS

## 2022-07-26 MED ORDER — LIDOCAINE HCL (CARDIAC) PF 100 MG/5ML IV SOSY
PREFILLED_SYRINGE | INTRAVENOUS | Status: DC | PRN
  Administered 2022-07-26: 50 mg via INTRAVENOUS

## 2022-07-26 MED ORDER — KETOROLAC TROMETHAMINE 30 MG/ML IJ SOLN
INTRAMUSCULAR | Status: DC | PRN
  Administered 2022-07-26: 15 mg via INTRAVENOUS

## 2022-07-26 MED ORDER — SODIUM CHLORIDE 0.9 % IV SOLN
INTRAVENOUS | Status: AC
  Filled 2022-07-26 (×2): qty 10

## 2022-07-26 MED ORDER — SODIUM CHLORIDE 0.9 % IV SOLN
INTRAVENOUS | Status: DC | PRN
  Administered 2022-07-26: 100 mL

## 2022-07-26 MED ORDER — OXYCODONE HCL 5 MG PO TABS: 5.0000 mg | ORAL_TABLET | Freq: Four times a day (QID) | ORAL | 0 refills | Status: DC | PRN

## 2022-07-26 MED ORDER — LIDOCAINE 2% (20 MG/ML) 5 ML SYRINGE
INTRAMUSCULAR | Status: AC
  Filled 2022-07-26: qty 5

## 2022-07-26 MED ORDER — IBUPROFEN 800 MG PO TABS: 800.0000 mg | ORAL_TABLET | Freq: Three times a day (TID) | ORAL | 0 refills | Status: DC | PRN

## 2022-07-26 SURGICAL SUPPLY — 52 items
ADH SKN CLS APL DERMABOND .7 (GAUZE/BANDAGES/DRESSINGS) ×1
APL PRP STRL LF DISP 70% ISPRP (MISCELLANEOUS) ×1
APPLIER CLIP 9.375 MED OPEN (MISCELLANEOUS)
APR CLP MED 9.3 20 MLT OPN (MISCELLANEOUS)
BINDER BREAST 3XL (GAUZE/BANDAGES/DRESSINGS) ×1 IMPLANT
BINDER BREAST LRG (GAUZE/BANDAGES/DRESSINGS) IMPLANT
BINDER BREAST MEDIUM (GAUZE/BANDAGES/DRESSINGS) IMPLANT
BINDER BREAST XLRG (GAUZE/BANDAGES/DRESSINGS) IMPLANT
BINDER BREAST XXLRG (GAUZE/BANDAGES/DRESSINGS) IMPLANT
BLADE SURG 15 STRL LF DISP TIS (BLADE) ×1 IMPLANT
BLADE SURG 15 STRL SS (BLADE) ×2
CANISTER SUC SOCK COL 7IN (MISCELLANEOUS) IMPLANT
CANISTER SUCT 1200ML W/VALVE (MISCELLANEOUS) IMPLANT
CHLORAPREP W/TINT 26 (MISCELLANEOUS) ×2 IMPLANT
CLIP APPLIE 9.375 MED OPEN (MISCELLANEOUS) IMPLANT
COVER BACK TABLE 60X90IN (DRAPES) ×2 IMPLANT
COVER MAYO STAND STRL (DRAPES) ×2 IMPLANT
COVER PROBE W GEL 5X96 (DRAPES) ×2 IMPLANT
DERMABOND ADVANCED (GAUZE/BANDAGES/DRESSINGS) ×1
DERMABOND ADVANCED .7 DNX12 (GAUZE/BANDAGES/DRESSINGS) ×1 IMPLANT
DRAPE LAPAROSCOPIC ABDOMINAL (DRAPES) IMPLANT
DRAPE LAPAROTOMY 100X72 PEDS (DRAPES) ×2 IMPLANT
DRAPE UTILITY XL STRL (DRAPES) ×2 IMPLANT
ELECT COATED BLADE 2.86 ST (ELECTRODE) ×2 IMPLANT
ELECT REM PT RETURN 9FT ADLT (ELECTROSURGICAL) ×2
ELECTRODE REM PT RTRN 9FT ADLT (ELECTROSURGICAL) ×1 IMPLANT
GLOVE BIOGEL PI IND STRL 8 (GLOVE) ×1 IMPLANT
GLOVE BIOGEL PI INDICATOR 8 (GLOVE) ×1
GLOVE ECLIPSE 8.0 STRL XLNG CF (GLOVE) ×2 IMPLANT
GOWN STRL REUS W/ TWL LRG LVL3 (GOWN DISPOSABLE) ×2 IMPLANT
GOWN STRL REUS W/ TWL XL LVL3 (GOWN DISPOSABLE) ×1 IMPLANT
GOWN STRL REUS W/TWL LRG LVL3 (GOWN DISPOSABLE) ×4
GOWN STRL REUS W/TWL XL LVL3 (GOWN DISPOSABLE) ×2
HEMOSTAT ARISTA ABSORB 3G PWDR (HEMOSTASIS) IMPLANT
HEMOSTAT SNOW SURGICEL 2X4 (HEMOSTASIS) IMPLANT
KIT MARKER MARGIN INK (KITS) ×2 IMPLANT
NDL HYPO 25X1 1.5 SAFETY (NEEDLE) ×1 IMPLANT
NEEDLE HYPO 25X1 1.5 SAFETY (NEEDLE) ×2 IMPLANT
NS IRRIG 1000ML POUR BTL (IV SOLUTION) ×2 IMPLANT
PACK BASIN DAY SURGERY FS (CUSTOM PROCEDURE TRAY) ×2 IMPLANT
PENCIL SMOKE EVACUATOR (MISCELLANEOUS) ×2 IMPLANT
SLEEVE SCD COMPRESS KNEE MED (STOCKING) ×2 IMPLANT
SPIKE FLUID TRANSFER (MISCELLANEOUS) IMPLANT
SPONGE T-LAP 4X18 ~~LOC~~+RFID (SPONGE) ×2 IMPLANT
SUT MNCRL AB 4-0 PS2 18 (SUTURE) ×2 IMPLANT
SUT SILK 2 0 SH (SUTURE) IMPLANT
SUT VICRYL 3-0 CR8 SH (SUTURE) ×2 IMPLANT
SYR CONTROL 10ML LL (SYRINGE) ×2 IMPLANT
TOWEL GREEN STERILE FF (TOWEL DISPOSABLE) ×2 IMPLANT
TRAY FAXITRON CT DISP (TRAY / TRAY PROCEDURE) ×2 IMPLANT
TUBE CONNECTING 20X1/4 (TUBING) IMPLANT
YANKAUER SUCT BULB TIP NO VENT (SUCTIONS) IMPLANT

## 2022-07-26 NOTE — Anesthesia Procedure Notes (Signed)
Procedure Name: LMA Insertion Date/Time: 07/26/2022 1:50 PM  Performed by: Bufford Spikes, CRNAPre-anesthesia Checklist: Patient identified, Emergency Drugs available, Suction available and Patient being monitored Patient Re-evaluated:Patient Re-evaluated prior to induction Oxygen Delivery Method: Circle system utilized Preoxygenation: Pre-oxygenation with 100% oxygen Induction Type: IV induction Ventilation: Mask ventilation without difficulty LMA: LMA inserted LMA Size: 4.0 Number of attempts: 1 Airway Equipment and Method: Bite block Placement Confirmation: positive ETCO2 Tube secured with: Tape Dental Injury: Teeth and Oropharynx as per pre-operative assessment

## 2022-07-26 NOTE — Discharge Instructions (Addendum)
[place] Office Phone Number [phone]  BREAST BIOPSY/ PARTIAL MASTECTOMY: POST OP INSTRUCTIONS  Always review your discharge instruction sheet given to you by the facility where your surgery was performed.  IF YOU HAVE DISABILITY OR FAMILY LEAVE FORMS, YOU MUST BRING THEM TO THE OFFICE FOR PROCESSING.  DO NOT GIVE THEM TO YOUR DOCTOR.  A prescription for pain medication may be given to you upon discharge.  Take your pain medication as prescribed, if needed.  If narcotic pain medicine is not needed, then you may take acetaminophen (Tylenol) or ibuprofen (Advil) as needed. Take your usually prescribed medications unless otherwise directed If you need a refill on your pain medication, please contact your pharmacy.  They will contact our office to request authorization.  Prescriptions will not be filled after 5pm or on week-ends. You should eat very light the first 24 hours after surgery, such as soup, crackers, pudding, etc.  Resume your normal diet the day after surgery. Most patients will experience some swelling and bruising in the breast.  Ice packs and a good support bra will help.  Swelling and bruising can take several days to resolve.  It is common to experience some constipation if taking pain medication after surgery.  Increasing fluid intake and taking a stool softener will usually help or prevent this problem from occurring.  A mild laxative (Milk of Magnesia or Miralax) should be taken according to package directions if there are no bowel movements after 48 hours. Unless discharge instructions indicate otherwise, you may remove your bandages 24-48 hours after surgery, and you may shower at that time.  You may have steri-strips (small skin tapes) in place directly over the incision.  These strips should be left on the skin for 7-10 days.  If your surgeon used skin glue on the incision, you may shower in 24 hours.  The glue will flake off over the next 2-3 weeks.  Any  sutures or staples will be removed at the office during your follow-up visit. ACTIVITIES:  You may resume regular daily activities (gradually increasing) beginning the next day.  Wearing a good support bra or sports bra minimizes pain and swelling.  You may have sexual intercourse when it is comfortable. You may drive when you no longer are taking prescription pain medication, you can comfortably wear a seatbelt, and you can safely maneuver your car and apply brakes. RETURN TO WORK:  ______________________________________________________________________________________ Dennis Bast should see your doctor in the office for a follow-up appointment approximately two weeks after your surgery.  Your doctor's nurse will typically make your follow-up appointment when she calls you with your pathology report.  Expect your pathology report 2-3 business days after your surgery.  You may call to check if you do not hear from [place] after three days. OTHER INSTRUCTIONS: _______________________________________________________________________________________________ _____________________________________________________________________________________________________________________________________ _____________________________________________________________________________________________________________________________________ _____________________________________________________________________________________________________________________________________  WHEN TO CALL YOUR DOCTOR: Fever over 101.0 Nausea and/or vomiting. Extreme swelling or bruising. Continued bleeding from incision. Increased pain, redness, or drainage from the incision.  The clinic staff is available to answer your questions during regular business hours.  Please don't hesitate to call and ask to speak to one of the nurses for clinical concerns.  If you have a medical emergency, go to the nearest emergency room or call 911.  A surgeon from [facility] is always on call at the hospital.  For further questions, please visit [email or web address]    May take Tylenol after 6:25pm, if needed.  May take NSAIDS (ibuprofen, motrin) after 8:25pm,  if needed.   Post Anesthesia Home Care Instructions  Activity: Get plenty of rest for the remainder of the day. A responsible individual must stay with you for 24 hours following the procedure.  For the next 24 hours, DO NOT: -Drive a car -Paediatric nurse -Drink alcoholic beverages -Take any medication unless instructed by your physician -Make any legal decisions or sign important papers.  Meals: Start with liquid foods such as gelatin or soup. Progress to regular foods as tolerated. Avoid greasy, spicy, heavy foods. If nausea and/or vomiting occur, drink only clear liquids until the nausea and/or vomiting subsides. Call your physician if vomiting continues.  Special Instructions/Symptoms: Your throat may feel dry or sore from the anesthesia or the breathing tube placed in your throat during surgery. If this causes discomfort, gargle with warm salt water. The discomfort should disappear within 24 hours.  If you had a scopolamine patch placed behind your ear for the management of post- operative nausea and/or vomiting:  1. The medication in the patch is effective for 72 hours, after which it should be removed.  Wrap patch in a tissue and discard in the trash. Wash hands thoroughly with soap and water. 2. You may remove the patch earlier than 72 hours if you experience unpleasant side effects which may include dry mouth, dizziness or visual disturbances. 3. Avoid touching the patch. Wash your hands with soap and water after contact with the patch.

## 2022-07-26 NOTE — Op Note (Signed)
Preoperative diagnosis: Left breast papilloma upper outer quadrant  Postoperative diagnosis: Same  Procedure: Left breast lumpectomy seed localized  Surgeon: Thomas Cornett, MD    Assistant: Dr. Kevin Ig-Izevbekha MD  I was present for the entire procedure from insertion of the scope until removal of the scope.   Anesthesia: LMA with 0.25% Marcaine with epinephrine  EBL: Minimal  Specimen: Left breast tissue with seed and clip verified by Faxitron  IV fluids Per anesthesia record   Indications for procedure: The patient presents due to mammographic abnormality.  A papilloma was discovered on core biopsy.  We discussed the pros and cons of observation versus possible removal.  Discussed potential upgrade risk to malignancy which is low but not 0. She opted for left breast seed localized lumpectomy.The procedure has been discussed with the patient. Alternatives to surgery have been discussed with the patient.  Risks of surgery include bleeding,  Infection,  Seroma formation, death,  and the need for further surgery.   The patient understands and wishes to proceed.     Description of procedure: The patient was met in the holding area and questions were answered.  She was taken back to the operative room placed upon the OR table.  Of note the left breast was marked as the correct site and films were available for review.  After induction of general anesthesia, left breast was prepped and draped in a sterile fashion and timeout performed.  Proper patient, site and procedure verified.  Neoprobe used identify the seed in the left breast lateral and circumareolar.  Circumareolar incision was made along the lateral border of the nipple areolar complex.  Dissection was carried down all tissue and the seed and clip were excised with a grossly negative margin.  Hemostasis achieved with cautery.  Irrigation was used and the Faxitron image revealed the seed and clip to be in the specimen and present.   Of note the specimen was inked and sent to pathology.  After making the cavity hemostatic and irrigation local anesthetic was infiltrated.  We closed with 3-0 Vicryl.  4 Monocryl was used to close the skin in a subcuticular fashion.  Dermabond was applied.  All counts found to be correct.  The patient was awoke extubated taken to recovery in satisfactory condition. 

## 2022-07-26 NOTE — Interval H&P Note (Signed)
History and Physical Interval Note:  07/26/2022 1:08 PM  Anita Hughes  has presented today for surgery, with the diagnosis of PAPILLOMA LEFT BREAST.  The various methods of treatment have been discussed with the patient and family. After consideration of risks, benefits and other options for treatment, the patient has consented to  Procedure(s): LEFT BREAST LUMPECTOMY WITH RADIOACTIVE SEED LOCALIZATION (Left) as a surgical intervention.  The patient's history has been reviewed, patient examined, no change in status, stable for surgery.  I have reviewed the patient's chart and labs.  Questions were answered to the patient's satisfaction.     [place]

## 2022-07-26 NOTE — Anesthesia Preprocedure Evaluation (Signed)
Anesthesia Evaluation  Patient identified by MRN, date of birth, ID band Patient awake    Reviewed: Allergy & Precautions, H&P , NPO status , Patient's Chart, lab work & pertinent test results  Airway Mallampati: II   Neck ROM: full    Dental   Pulmonary neg pulmonary ROS,    breath sounds clear to auscultation       Cardiovascular hypertension, + dysrhythmias Supra Ventricular Tachycardia  Rhythm:regular Rate:Normal     Neuro/Psych negative neurological ROS     GI/Hepatic hiatal hernia, GERD  ,  Endo/Other  Morbid obesity  Renal/GU      Musculoskeletal   Abdominal   Peds  Hematology   Anesthesia Other Findings   Reproductive/Obstetrics                             Anesthesia Physical Anesthesia Plan  ASA: 2  Anesthesia Plan: General   Post-op Pain Management:    Induction: Intravenous  PONV Risk Score and Plan: 3 and Ondansetron, Dexamethasone, Midazolam and Treatment may vary due to age or medical condition  Airway Management Planned: LMA  Additional Equipment:   Intra-op Plan:   Post-operative Plan: Extubation in OR  Informed Consent: I have reviewed the patients History and Physical, chart, labs and discussed the procedure including the risks, benefits and alternatives for the proposed anesthesia with the patient or authorized representative who has indicated his/her understanding and acceptance.     Dental advisory given  Plan Discussed with: CRNA, Anesthesiologist and Surgeon  Anesthesia Plan Comments:         Anesthesia Quick Evaluation

## 2022-07-26 NOTE — H&P (Signed)
History of Present Illness: Anita Hughes is a 62 y.o. female who is seen today as an office consultation for evaluation of New Patient (Left breast papilloma ) .   Patient presents for evaluation of left nipple discharge. Work-up revealed a papilloma below the left nipple. No atypia. Her drainage is mild in nature and clear. Does stain her bra every day though. Denies any breast pain breast mass or other problem with her breast. No significant history of breast cancer.  Review of Systems: A complete review of systems was obtained from the patient. I have reviewed this information and discussed as appropriate with the patient. See HPI as well for other ROS.    Medical History: Past Medical History:  Diagnosis Date  Anxiety  Arthritis  GERD (gastroesophageal reflux disease)  Hypertension   There is no problem list on file for this patient.  Past Surgical History:  Procedure Laterality Date  HYSTERECTOMY    No Known Allergies  Current Outpatient Medications on File Prior to Visit  Medication Sig Dispense Refill  dilTIAZem (CARDIZEM CD) 240 MG CD capsule Take 240 mg by mouth once daily  pantoprazole (PROTONIX) 40 MG DR tablet Take 1 tablet by mouth daily. 05/31/22-Appointment REQUIRED for further refills or obtain from PCP   No current facility-administered medications on file prior to visit.   Family History  Problem Relation Age of Onset  High blood pressure (Hypertension) Mother  Hyperlipidemia (Elevated cholesterol) Mother  High blood pressure (Hypertension) Father  Hyperlipidemia (Elevated cholesterol) Father  Breast cancer Sister    Social History   Tobacco Use  Smoking Status Never  Smokeless Tobacco Never    Social History   Socioeconomic History  Marital status: Married  Tobacco Use  Smoking status: Never  Smokeless tobacco: Never  Substance and Sexual Activity  Alcohol use: Never  Drug use: Never   Objective:   Vitals:  06/19/22 1611  BP:  134/80  Pulse: 96  Temp: 36.7 C (98 F)  SpO2: 98%  Weight: (!) 104.1 kg (229 lb 9.6 oz)  Height: 154.9 cm ('5\' 1"'$ )   Body mass index is 43.38 kg/m.  Physical Exam HENT:  Head: Normocephalic.  Eyes:  Pupils: Pupils are equal, round, and reactive to light.  Cardiovascular:  Rate and Rhythm: Normal rate.  Chest:  Breasts: Right: Normal. No mass or nipple discharge.  Left: Normal. No mass or nipple discharge.  Musculoskeletal:  Cervical back: Normal range of motion.  Lymphadenopathy:  Upper Body:  Right upper body: No supraclavicular or axillary adenopathy.  Left upper body: No supraclavicular or axillary adenopathy.  Neurological:  Mental Status: She is alert.    Labs, Imaging and Diagnostic Testing:  CLINICAL DATA: Left nipple discharge  EXAM: DIGITAL DIAGNOSTIC BILATERAL MAMMOGRAM WITH TOMOSYNTHESIS AND CAD; ULTRASOUND LEFT BREAST LIMITED  TECHNIQUE: Bilateral digital diagnostic mammography and breast tomosynthesis was performed. The images were evaluated with computer-aided detection.; Targeted ultrasound examination of the left breast was performed.  COMPARISON: Previous exam(s).  ACR Breast Density Category b: There are scattered areas of fibroglandular density.  FINDINGS: No suspicious masses, calcifications, or distortion are identified in either breast.  On physical exam, no suspicious lumps are identified.  Targeted ultrasound is performed, showing a single intraductal mass in the left breast at 3 o'clock in the retroareolar region measuring up to 10 mm in length. No axillary adenopathy.  IMPRESSION: There is an intraductal mass on the left at 3 o'clock in the retroareolar region, likely explaining the patient's discharge.  RECOMMENDATION:  Recommend ultrasound-guided biopsy of the intraductal mass on the left at 3 o'clock.  I have discussed the findings and recommendations with the patient. If applicable, a reminder letter will be sent to  the patient regarding the next appointment.  BI-RADS CATEGORY 4: Suspicious.   Electronically Signed By: Anita Hughes M.D. On: 05/22/2022 15:03  Diagnosis Breast, left, needle core biopsy, 3:00 - INTRADUCTAL PAPILLOMA. - SEE NOTE Diagnosis Note Called to [facility] on 05/25/2022. Anita Laws MD  Assessment and Plan:   Diagnoses and all orders for this visit:  Papilloma of left breast    Discussed the pros and cons of left breast lumpectomy for papilloma. Given her symptoms she would like to have it removed. We discussed seed localized left breast lumpectomy. Risk of bleeding, infection, nipple numbness, nipple loss, cosmetic deformity, anesthesia risk and need for other procedures and or treatments discussed with the patient today. She voices understanding and agrees to proceed. Alternatives to surgery discussed as well.

## 2022-07-26 NOTE — Transfer of Care (Signed)
Immediate Anesthesia Transfer of Care Note  Patient: Anita Hughes  Procedure(s) Performed: LEFT BREAST LUMPECTOMY WITH RADIOACTIVE SEED LOCALIZATION (Left: Breast)  Patient Location: PACU  Anesthesia Type:General  Level of Consciousness: awake, alert  and oriented  Airway & Oxygen Therapy: Patient Spontanous Breathing and Patient connected to nasal cannula oxygen  Post-op Assessment: Report given to RN and Post -op Vital signs reviewed and stable  Post vital signs: Reviewed and stable  Last Vitals:  Vitals Value Taken Time  BP 145/67 07/26/22 1442  Temp    Pulse 77 07/26/22 1444  Resp 7 07/26/22 1445  SpO2 100 % 07/26/22 1444  Vitals shown include unvalidated device data.  Last Pain:  Vitals:   07/26/22 1220  TempSrc: Oral  PainSc: 0-No pain      Patients Stated Pain Goal: 5 (66/06/00 4599)  Complications: No notable events documented.

## 2022-07-26 NOTE — Anesthesia Postprocedure Evaluation (Signed)
Anesthesia Post Note  Patient: Anita Hughes  Procedure(s) Performed: LEFT BREAST LUMPECTOMY WITH RADIOACTIVE SEED LOCALIZATION (Left: Breast)     Patient location during evaluation: PACU Anesthesia Type: General Level of consciousness: awake and alert Pain management: pain level controlled Vital Signs Assessment: post-procedure vital signs reviewed and stable Respiratory status: spontaneous breathing, nonlabored ventilation, respiratory function stable and patient connected to nasal cannula oxygen Cardiovascular status: blood pressure returned to baseline and stable Postop Assessment: no apparent nausea or vomiting Anesthetic complications: no   No notable events documented.  Last Vitals:  Vitals:   07/26/22 1500 07/26/22 1515  BP: (!) 148/78 (!) 153/76  Pulse: 71 76  Resp: 20 16  Temp:  (!) 36.4 C  SpO2: 100% 99%    Last Pain:  Vitals:   07/26/22 1500  TempSrc:   PainSc: 0-No pain                 [name]

## 2022-07-27 ENCOUNTER — Encounter (HOSPITAL_BASED_OUTPATIENT_CLINIC_OR_DEPARTMENT_OTHER): Payer: Self-pay | Admitting: Surgery

## 2022-07-30 ENCOUNTER — Encounter: Payer: Self-pay | Admitting: Surgery

## 2022-07-30 LAB — SURGICAL PATHOLOGY

## 2022-07-31 ENCOUNTER — Encounter: Payer: Self-pay | Admitting: Surgery

## 2022-08-01 ENCOUNTER — Encounter (HOSPITAL_COMMUNITY): Payer: Self-pay

## 2022-09-14 ENCOUNTER — Encounter: Payer: Self-pay | Admitting: Cardiology

## 2022-09-14 ENCOUNTER — Ambulatory Visit: Payer: Federal, State, Local not specified - PPO | Admitting: Cardiology

## 2022-09-14 ENCOUNTER — Other Ambulatory Visit: Payer: Self-pay | Admitting: Cardiology

## 2022-09-14 VITALS — BP 175/90 | HR 97 | Temp 98.3°F | Resp 12 | Ht 62.0 in | Wt 231.0 lb

## 2022-09-14 DIAGNOSIS — R7303 Prediabetes: Secondary | ICD-10-CM

## 2022-09-14 DIAGNOSIS — E7800 Pure hypercholesterolemia, unspecified: Secondary | ICD-10-CM

## 2022-09-14 DIAGNOSIS — I10 Essential (primary) hypertension: Secondary | ICD-10-CM

## 2022-09-14 DIAGNOSIS — I471 Supraventricular tachycardia, unspecified: Secondary | ICD-10-CM

## 2022-09-14 DIAGNOSIS — I209 Angina pectoris, unspecified: Secondary | ICD-10-CM

## 2022-09-14 DIAGNOSIS — E559 Vitamin D deficiency, unspecified: Secondary | ICD-10-CM

## 2022-09-14 MED ORDER — NITROGLYCERIN 0.4 MG SL SUBL: 0.4000 mg | SUBLINGUAL_TABLET | SUBLINGUAL | 3 refills | Status: DC | PRN

## 2022-09-14 MED ORDER — METOPROLOL SUCCINATE ER 50 MG PO TB24: 50.0000 mg | ORAL_TABLET | Freq: Every evening | ORAL | 2 refills | Status: DC

## 2022-09-14 MED ORDER — ASPIRIN 81 MG PO CHEW: 81.0000 mg | CHEWABLE_TABLET | Freq: Every day | ORAL | Status: AC

## 2022-09-14 NOTE — Progress Notes (Signed)
Primary Physician/Referring:  Aura Dials, PA-C  Patient ID: Anita Hughes, female    DOB: [birth year], 62 y.o.   MRN: [id number]  Chief Complaint  Patient presents with   Chest Pain   HPI:    Anita Hughes  is a 62 y.o. African-American female patient with history of PSVT first diagnosed in 2018, morbid obesity with mild OSA not on CPAP, prediabetes mellitus, hypertension, hyperlipidemia, history of GERD, hiatal hernia who had seen her about 6 to 8 months ago, referred back to me for evaluation of chest pain that started about a week ago.  Chest pain symptoms in the left precordial area and left breast area with exertional activity and relieved with rest, taking approximately 5 to 7 minutes, has had frequent episodes with exertion and relieved with rest or also has had episodes with stressful situations.  Today she has not had any further chest pain this morning and last evening.  She also has exertional dyspnea but does not have any PND or orthopnea.  Past Medical History:  Diagnosis Date   Colon polyps    GERD (gastroesophageal reflux disease)    Hemorrhoids    Hiatal hernia    Hyperlipidemia    Hypertension    Obesity    SVT (supraventricular tachycardia) ([place])    Past Surgical History:  Procedure Laterality Date   ABDOMINAL HYSTERECTOMY  01/01/2000   FIBROIDS   BREAST BIOPSY Left 05/24/2022   BREAST LUMPECTOMY WITH RADIOACTIVE SEED LOCALIZATION Left 07/26/2022   Procedure: LEFT BREAST LUMPECTOMY WITH RADIOACTIVE SEED LOCALIZATION;  Surgeon: Erroll Luna, MD;  Location: [facility];  Service: General;  Laterality: Left;   CESAREAN SECTION  12/31/1988   Family History  Problem Relation Age of Onset   Hypertension Mother    Heart attack Father 72       2 heart attacks   Hypertension Sister    Hypertension Sister    Hypertension Sister    Hypertension Brother     Social History   Tobacco Use   Smoking status: Never   Smokeless tobacco:  Never  Substance Use Topics   Alcohol use: No    Alcohol/week: 0.0 standard drinks of alcohol   Marital Status: Married  ROS  Review of Systems  Cardiovascular:  Positive for chest pain and dyspnea on exertion. Negative for leg swelling and palpitations.  Respiratory:  Positive for snoring.   Gastrointestinal:  Negative for melena.   Objective  Blood pressure (!) 175/90, pulse 97, temperature 98.3 F (36.8 C), temperature source Temporal, resp. rate 12, height '5\' 2"'$  (1.575 m), weight 231 lb (104.8 kg), last menstrual period 03/21/2000, SpO2 99 %. Body mass index is 42.25 kg/m.     09/14/2022    8:43 AM 09/14/2022    8:35 AM 07/26/2022    3:15 PM  Vitals with BMI  Height  '5\' 2"'$    Weight  231 lbs   BMI  27.06   Systolic 237 628 315  Diastolic 90 89 76  Pulse 97 80 76    Physical Exam Constitutional:      Appearance: She is morbidly obese.  Neck:     Vascular: No carotid bruit or JVD.  Cardiovascular:     Rate and Rhythm: Normal rate and regular rhythm.     Pulses: Intact distal pulses.          Dorsalis pedis pulses are 1+ on the right side and 1+ on the left side.  Posterior tibial pulses are 2+ on the right side and 2+ on the left side.     Heart sounds: Normal heart sounds. No murmur heard.    No gallop.  Pulmonary:     Effort: Pulmonary effort is normal.     Breath sounds: Normal breath sounds.  Abdominal:     General: Bowel sounds are normal.     Palpations: Abdomen is soft.  Musculoskeletal:     Right lower leg: No edema.     Left lower leg: No edema.      Laboratory examination:   No results for input(s): "NA", "K", "CL", "CO2", "GLUCOSE", "BUN", "CREATININE", "CALCIUM", "GFRNONAA", "GFRAA" in the last 8760 hours.  CrCl cannot be calculated (Patient's most recent lab result is older than the maximum 21 days allowed.).     Latest Ref Rng & Units 03/20/2021    3:42 PM 12/11/2020    9:11 AM 05/09/2018    7:19 AM  CMP  Glucose 70 - 99 mg/dL  166  107    BUN 6 - 20 mg/dL  11  13   Creatinine 0.44 - 1.00 mg/dL  0.86  0.76   Sodium 135 - 145 mmol/L  138  140   Potassium 3.5 - 5.1 mmol/L  4.0  3.8   Chloride 98 - 111 mmol/L  106  101   CO2 22 - 32 mmol/L  19  24   Calcium 8.9 - 10.3 mg/dL  9.1  9.5   Total Protein 6.0 - 8.3 g/dL 7.9  7.3  6.6   Total Bilirubin 0.2 - 1.2 mg/dL 0.4  0.6  0.3   Alkaline Phos 39 - 117 U/L 68  69  70   AST 0 - 37 U/L 25  78  15   ALT 0 - 35 U/L 20  60  17       Latest Ref Rng & Units 09/14/2022    9:49 AM 12/11/2020    9:11 AM 05/09/2018    7:19 AM  CBC  WBC 3.4 - 10.8 x10E3/uL 5.8  7.9  6.0   Hemoglobin 11.1 - 15.9 g/dL 13.5  14.3  13.5   Hematocrit 34.0 - 46.6 % 41.2  46.1  38.7   Platelets 150 - 450 x10E3/uL 334  331  305     Lipid Panel Recent Labs    09/14/22 0949  CHOL 254*  TRIG 102  LDLCALC 185*  HDL 51   Lipid Panel     Component Value Date/Time   CHOL 254 (H) 09/14/2022 0949   TRIG 102 09/14/2022 0949   HDL 51 09/14/2022 0949   CHOLHDL 4.4 05/09/2018 0719   CHOLHDL 4.9 03/21/2012 1528   VLDL 20 03/21/2012 1528   LDLCALC 185 (H) 09/14/2022 0949   LABVLDL 18 09/14/2022 0949     HEMOGLOBIN A1C Lab Results  Component Value Date   HGBA1C 6.2 (H) 09/14/2022   TSH Recent Labs    09/14/22 0949  TSH 1.680    External labs:   Labs 06/27/2021:  Sodium 141, K4.5, creatinine 0.99, GFR 65, LFTs normal,  LDL 199, HDL 56, TG 112, TC 275,  HgbA1c 6%, TSH 1.4.  Medications and allergies   Allergies  Allergen Reactions   Pravastatin Other (See Comments)    Shortness of breath   Rosuvastatin Other (See Comments)    Patient wakes up with short breaths/hard to catch breaths when taking this med.     Medication list after today's encounter  Current Outpatient Medications:    aspirin (ASPIRIN CHILDRENS) 81 MG chewable tablet, Chew 1 tablet (81 mg total) by mouth daily., Disp: , Rfl:    diltiazem (CARDIZEM CD) 240 MG 24 hr capsule, TAKE 1 CAPSULE BY MOUTH EVERY DAY, Disp: 30  capsule, Rfl: 2   FLUoxetine (PROZAC) 20 MG capsule, Take 20 mg by mouth daily., Disp: , Rfl:    metoprolol succinate (TOPROL-XL) 50 MG 24 hr tablet, Take 1 tablet (50 mg total) by mouth every evening. Take with or immediately following a meal., Disp: 30 tablet, Rfl: 2   nitroGLYCERIN (NITROSTAT) 0.4 MG SL tablet, Place 1 tablet (0.4 mg total) under the tongue every 5 (five) minutes as needed for chest pain., Disp: 90 tablet, Rfl: 3   pantoprazole (PROTONIX) 40 MG tablet, Take 40 mg by mouth daily., Disp: , Rfl:    Vitamin D, Ergocalciferol, (DRISDOL) 1.25 MG (50000 UNIT) CAPS capsule, Take 1 capsule (50,000 Units total) by mouth every 7 (seven) days., Disp: 5 capsule, Rfl: 0   ezetimibe-simvastatin (VYTORIN) 10-20 MG tablet, Take 1 tablet by mouth daily at 6 PM., Disp: 30 tablet, Rfl: 2   Radiology:   No results found.  Cardiac Studies:   Echocardiogram 05/16/2018:  - Left ventricle: The cavity size was normal. There was mild focal   basal hypertrophy of the septum. Systolic function was normal.   The estimated ejection fraction was in the range of 55% to 60%.   Wall motion was normal; there were no regional wall motion   abnormalities. Doppler parameters are consistent with abnormal   left ventricular relaxation (grade 1 diastolic dysfunction).  Stress echo from Feb 05, 2020  LV EF: 55-60%. Grade 1 diastolic dysfunction  Stress: Normal functional capacity - 7.2 mets.  Stress ECG: ECG - No ischemic ST segment changes occurred with stress.  No evidence of inducible ischemia at HR achieved. normal LV fxn and no signif valve dz.  EKG:   EKG 08/14/2022: Normal sinus rhythm at a rate of 84 bpm, left atrial enlargement, otherwise normal EKG.  Compared to 10/03/2021, no change.  EKG 12/11/2020: SVT at the rate of 171 bpm.  No evidence of ischemia.  Assessment     ICD-10-CM   1. Angina pectoris (HCC)  I20.9 EKG 12-Lead    Lipid Panel With LDL/HDL Ratio    TSH    Hgb A1c w/o eAG    CBC     COMPLETE METABOLIC PANEL WITH GFR    PCV ECHOCARDIOGRAM COMPLETE    PCV MYOCARDIAL PERFUSION WO LEXISCAN    CT CARDIAC SCORING ([facility] LOCATIONS ONLY)    aspirin (ASPIRIN CHILDRENS) 81 MG chewable tablet    nitroGLYCERIN (NITROSTAT) 0.4 MG SL tablet    metoprolol succinate (TOPROL-XL) 50 MG 24 hr tablet    2. Supraventricular tachycardia (HCC)  I47.1     3. Primary hypertension  I10 TSH    VITAMIN D 25 Hydroxy (Vit-D Deficiency, Fractures)    metoprolol succinate (TOPROL-XL) 50 MG 24 hr tablet    4. Pure hypercholesterolemia  E78.00 ezetimibe-simvastatin (VYTORIN) 10-20 MG tablet    5. Pre-diabetes  R73.03 Hgb A1c w/o eAG    6. Hypovitaminosis D  E55.9 Vitamin D, Ergocalciferol, (DRISDOL) 1.25 MG (50000 UNIT) CAPS capsule       Medications Discontinued During This Encounter  Medication Reason   ibuprofen (ADVIL) 800 MG tablet    oxyCODONE (OXY IR/ROXICODONE) 5 MG immediate release tablet    diltiazem (CARDIZEM) 30 MG tablet  ezetimibe-simvastatin (VYTORIN) 10-20 MG tablet     Meds ordered this encounter  Medications   aspirin (ASPIRIN CHILDRENS) 81 MG chewable tablet    Sig: Chew 1 tablet (81 mg total) by mouth daily.   nitroGLYCERIN (NITROSTAT) 0.4 MG SL tablet    Sig: Place 1 tablet (0.4 mg total) under the tongue every 5 (five) minutes as needed for chest pain.    Dispense:  90 tablet    Refill:  3   metoprolol succinate (TOPROL-XL) 50 MG 24 hr tablet    Sig: Take 1 tablet (50 mg total) by mouth every evening. Take with or immediately following a meal.    Dispense:  30 tablet    Refill:  2   Vitamin D, Ergocalciferol, (DRISDOL) 1.25 MG (50000 UNIT) CAPS capsule    Sig: Take 1 capsule (50,000 Units total) by mouth every 7 (seven) days.    Dispense:  5 capsule    Refill:  0    Refills to Roe Coombs, PA    Orders Placed This Encounter  Procedures   CT CARDIAC SCORING ([facility] LOCATIONS ONLY)    [employer] PPO/pt is aware if insurnace do not cover   there  responsible for  $99  Wt 231/No Needs  /No spinal cord/No body injector/no glucose mon/no heart monitor//ab w/pt Please remember if you need to cancel your appt, please do so 24 hours prior to your appointment to avoid getting charged a no-show fee of $75.00 pt is aware/pt verbal understood instructions given    Standing Status:   Future    Standing Expiration Date:   11/14/2022    Order Specific Question:   Preferred imaging location?    Answer:   [facility]   Lipid Panel With LDL/HDL Ratio   TSH   Hgb A1c w/o eAG   CBC   COMPLETE METABOLIC PANEL WITH GFR   VITAMIN D 25 Hydroxy (Vit-D Deficiency, Fractures)   PCV MYOCARDIAL PERFUSION WO LEXISCAN    Standing Status:   Future    Standing Expiration Date:   11/14/2022   EKG 12-Lead   PCV ECHOCARDIOGRAM COMPLETE    Standing Status:   Future    Standing Expiration Date:   09/15/2023   Recommendations:   Anita Hughes is a 62 y.o. African-American female patient with history of PSVT first diagnosed in 2018, morbid obesity with mild OSA not on CPAP, prediabetes mellitus, hypertension, hyperlipidemia, history of GERD, hiatal hernia who had seen her about 6 to 8 months ago, referred back to me for evaluation of chest pain that started about a week ago.  Chest pain symptoms in the left precordial area and left breast area with exertional activity and relieved with rest, taking approximately 5 to 7 minutes, has had frequent episodes with exertion and relieved with rest or also has had episodes with stressful situations.  Today she has not had any further chest pain this morning and last evening.  She also has exertional dyspnea but does not have any PND or orthopnea.  Physical examination except for morbid obesity is unremarkable.  EKG is also unremarkable.  However patient has significant cardiovascular risk including marked hyperlipidemia, about 2 to 3 years ago her LDL was 199.  She is also prediabetic and has not had any further work-up and has  not followed up with physicians as well.  States that her work is very stressful and she does not have time to take care of herself with regard to taking time off to  see physicians.  I will set her up for exercise nuclear stress test, echocardiogram, coronary CT calcium scoring, will start her on metoprolol succinate 50 mg in the evening, continue diltiazem CD in the morning for her SVT, fortunately has not had any further episodes.  I have also prescribed her sublingual nitroglycerin.  She still has medications with Vytorin 20/10 mg, that had previously started but she never started this as she had developed dyspnea with atorvastatin and pravastatin.  She is willing to restart this.  Aspirin was also started by me today.  I like to see her back in 4 to 6 weeks for follow-up unless she has more frequent episodes of chest pain.  Advised her not to do any heavy exertional activity and if she has frequent episodes of chest pain to come to the emergency room.  With regard to hyperlipidemia, she did not tolerate pravastatin or Crestor in the past but I would like to try low-dose of Vytorin 10/20 mg in the evening and obtain a repeat lipid profile in 2 months and I would like to see her back in 3 months for follow-up.    Adrian Prows, MD, [facility] 09/16/2022, 12:11 PM Office: [phone]

## 2022-09-15 LAB — LIPID PANEL WITH LDL/HDL RATIO
Cholesterol, Total: 254 mg/dL — ABNORMAL HIGH (ref 100–199)
HDL: 51 mg/dL (ref >39)
LDL Chol Calc (NIH): 185 mg/dL — ABNORMAL HIGH (ref 0–99)
LDL/HDL Ratio: 3.6 ratio — ABNORMAL HIGH (ref 0.0–3.2)
Triglycerides: 102 mg/dL (ref 0–149)
VLDL Cholesterol Cal: 18 mg/dL (ref 5–40)

## 2022-09-15 LAB — CBC
Hematocrit: 41.2 % (ref 34.0–46.6)
Hemoglobin: 13.5 g/dL (ref 11.1–15.9)
MCH: 28.5 pg (ref 26.6–33.0)
MCHC: 32.8 g/dL (ref 31.5–35.7)
MCV: 87 fL (ref 79–97)
Platelets: 334 10*3/uL (ref 150–450)
RBC: 4.74 x10E6/uL (ref 3.77–5.28)
RDW: 12.5 % (ref 11.7–15.4)
WBC: 5.8 10*3/uL (ref 3.4–10.8)

## 2022-09-15 LAB — VITAMIN D 25 HYDROXY (VIT D DEFICIENCY, FRACTURES): Vit D, 25-Hydroxy: 10.3 ng/mL — ABNORMAL LOW (ref 30.0–100.0)

## 2022-09-15 LAB — TSH: TSH: 1.68 u[IU]/mL (ref 0.450–4.500)

## 2022-09-15 LAB — HGB A1C W/O EAG: Hgb A1c MFr Bld: 6.2 % — ABNORMAL HIGH (ref 4.8–5.6)

## 2022-09-16 MED ORDER — VITAMIN D (ERGOCALCIFEROL) 1.25 MG (50000 UNIT) PO CAPS: 50000.0000 [IU] | ORAL_CAPSULE | ORAL | 0 refills | Status: DC

## 2022-09-16 NOTE — Addendum Note (Signed)
Addended by: Kela Millin on: 09/16/2022 12:11 PM   Modules accepted: Orders

## 2022-09-24 NOTE — Progress Notes (Signed)
Called pt no answer, left a vm to call back

## 2022-09-26 NOTE — Progress Notes (Signed)
Patient called back and was given her results she voiced understanding

## 2022-09-28 ENCOUNTER — Other Ambulatory Visit: Payer: Self-pay | Admitting: Cardiology

## 2022-09-28 DIAGNOSIS — I10 Essential (primary) hypertension: Secondary | ICD-10-CM

## 2022-09-28 DIAGNOSIS — I471 Supraventricular tachycardia, unspecified: Secondary | ICD-10-CM

## 2022-10-15 ENCOUNTER — Ambulatory Visit: Payer: Federal, State, Local not specified - PPO

## 2022-10-15 DIAGNOSIS — I209 Angina pectoris, unspecified: Secondary | ICD-10-CM

## 2022-10-25 ENCOUNTER — Other Ambulatory Visit: Payer: Self-pay

## 2022-10-25 ENCOUNTER — Emergency Department (HOSPITAL_COMMUNITY): Payer: Federal, State, Local not specified - PPO

## 2022-10-25 ENCOUNTER — Encounter (HOSPITAL_COMMUNITY): Payer: Self-pay | Admitting: Emergency Medicine

## 2022-10-25 ENCOUNTER — Emergency Department (HOSPITAL_COMMUNITY)
Admission: EM | Admit: 2022-10-25 | Discharge: 2022-10-25 | Disposition: A | Discharge status: Home / Self Care | Payer: Federal, State, Local not specified - PPO | Attending: Emergency Medicine | Admitting: Emergency Medicine

## 2022-10-25 DIAGNOSIS — Z7982 Long term (current) use of aspirin: Secondary | ICD-10-CM | POA: Diagnosis not present

## 2022-10-25 DIAGNOSIS — H53142 Visual discomfort, left eye: Secondary | ICD-10-CM | POA: Insufficient documentation

## 2022-10-25 DIAGNOSIS — H5712 Ocular pain, left eye: Secondary | ICD-10-CM | POA: Insufficient documentation

## 2022-10-25 LAB — COMPREHENSIVE METABOLIC PANEL
ALT: 17 U/L (ref 0–44)
AST: 20 U/L (ref 15–41)
Albumin: 3.9 g/dL (ref 3.5–5.0)
Alkaline Phosphatase: 72 U/L (ref 38–126)
Anion gap: 7 (ref 5–15)
BUN: 12 mg/dL (ref 8–23)
CO2: 23 mmol/L (ref 22–32)
Calcium: 9.3 mg/dL (ref 8.9–10.3)
Chloride: 109 mmol/L (ref 98–111)
Creatinine, Ser: 0.96 mg/dL (ref 0.44–1.00)
GFR, Estimated: >60 mL/min (ref >60)
Glucose, Bld: 131 mg/dL — ABNORMAL HIGH (ref 70–99)
Potassium: 4 mmol/L (ref 3.5–5.1)
Sodium: 139 mmol/L (ref 135–145)
Total Bilirubin: 0.5 mg/dL (ref 0.3–1.2)
Total Protein: 7.4 g/dL (ref 6.5–8.1)

## 2022-10-25 LAB — CBC WITH DIFFERENTIAL/PLATELET
Abs Immature Granulocytes: 0.02 10*3/uL (ref 0.00–0.07)
Basophils Absolute: 0 10*3/uL (ref 0.0–0.1)
Basophils Relative: 0 %
Eosinophils Absolute: 0.2 10*3/uL (ref 0.0–0.5)
Eosinophils Relative: 3 %
HCT: 42.7 % (ref 36.0–46.0)
Hemoglobin: 13.7 g/dL (ref 12.0–15.0)
Immature Granulocytes: 0 %
Lymphocytes Relative: 46 %
Lymphs Abs: 3.3 10*3/uL (ref 0.7–4.0)
MCH: 28.8 pg (ref 26.0–34.0)
MCHC: 32.1 g/dL (ref 30.0–36.0)
MCV: 89.7 fL (ref 80.0–100.0)
Monocytes Absolute: 0.6 10*3/uL (ref 0.1–1.0)
Monocytes Relative: 8 %
Neutro Abs: 3.2 10*3/uL (ref 1.7–7.7)
Neutrophils Relative %: 43 %
Platelets: 320 10*3/uL (ref 150–400)
RBC: 4.76 MIL/uL (ref 3.87–5.11)
RDW: 12.3 % (ref 11.5–15.5)
WBC: 7.3 10*3/uL (ref 4.0–10.5)
nRBC: 0 % (ref 0.0–0.2)

## 2022-10-25 LAB — SEDIMENTATION RATE: Sed Rate: 24 mm/hr — ABNORMAL HIGH (ref 0–22)

## 2022-10-25 LAB — C-REACTIVE PROTEIN: CRP: 0.6 mg/dL (ref <1.0)

## 2022-10-25 MED ORDER — FENTANYL CITRATE PF 50 MCG/ML IJ SOSY
50.0000 ug | PREFILLED_SYRINGE | Freq: Once | INTRAMUSCULAR | Status: AC
  Administered 2022-10-25: 50 ug via INTRAVENOUS
  Filled 2022-10-25: qty 1

## 2022-10-25 MED ORDER — TETRACAINE HCL 0.5 % OP SOLN
1.0000 [drp] | Freq: Once | OPHTHALMIC | Status: DC
  Filled 2022-10-25: qty 4

## 2022-10-25 MED ORDER — FLUORESCEIN SODIUM 1 MG OP STRP
1.0000 | ORAL_STRIP | Freq: Once | OPHTHALMIC | Status: DC
  Filled 2022-10-25: qty 1

## 2022-10-25 NOTE — ED Provider Notes (Signed)
[facility] DEPT Provider Note   CSN: [id number] Arrival date & time: 10/25/22  0433     History  Chief Complaint  Patient presents with   Eye Pain    Anita Hughes is a 62 y.o. female who presents with severe left eye pain and photophobia.  Patient will have on 10/25 yesterday with severe pain in the left eye as well as blurry vision.  Held ice on the eye all day yesterday, no improvement, woke this morning with worsening pain in the left eye.  No history of the same.  No numbness tingling or weakness in extremities, no confusion or changes in speech.  I personally reviewed medical records previous history of hiatal hernia, obesity, SVT, GERD.  No anticoagulation.   HPI     Home Medications Prior to Admission medications   Medication Sig Start Date End Date Taking? Authorizing Provider  aspirin (ASPIRIN CHILDRENS) 81 MG chewable tablet Chew 1 tablet (81 mg total) by mouth daily. 09/14/22   Adrian Prows, MD  diltiazem (CARDIZEM CD) 240 MG 24 hr capsule TAKE 1 CAPSULE BY MOUTH EVERY DAY 09/28/22   Adrian Prows, MD  ezetimibe-simvastatin (VYTORIN) 10-20 MG tablet Take 1 tablet by mouth daily at 6 PM. 09/14/22   Adrian Prows, MD  FLUoxetine (PROZAC) 20 MG capsule Take 20 mg by mouth daily. 07/27/21   [provider]  metoprolol succinate (TOPROL-XL) 50 MG 24 hr tablet Take 1 tablet (50 mg total) by mouth every evening. Take with or immediately following a meal. 09/14/22 12/13/22  Adrian Prows, MD  nitroGLYCERIN (NITROSTAT) 0.4 MG SL tablet Place 1 tablet (0.4 mg total) under the tongue every 5 (five) minutes as needed for chest pain. 09/14/22 12/13/22  Adrian Prows, MD  pantoprazole (PROTONIX) 40 MG tablet Take 40 mg by mouth daily. 06/16/19   [provider]  Vitamin D, Ergocalciferol, (DRISDOL) 1.25 MG (50000 UNIT) CAPS capsule Take 1 capsule (50,000 Units total) by mouth every 7 (seven) days. 09/16/22   Adrian Prows, MD      Allergies     Pravastatin and Rosuvastatin    Review of Systems   Review of Systems  Eyes:  Positive for photophobia, pain and visual disturbance.    Physical Exam Updated Vital Signs BP (!) 184/93   Pulse 77   Temp 98.4 F (36.9 C) (Oral)   Resp 20   Ht '5\' 2"'$  (1.575 m)   Wt 108.9 kg   LMP 03/21/2000   SpO2 100%   BMI 43.90 kg/m  Physical Exam Vitals and nursing note reviewed.  Constitutional:      Appearance: She is not ill-appearing.  HENT:     Head: Normocephalic and atraumatic.     Mouth/Throat:     Mouth: Mucous membranes are moist.     Pharynx: No oropharyngeal exudate or posterior oropharyngeal erythema.  Eyes:     General: Lids are normal.        Right eye: No discharge.        Left eye: No discharge.     Intraocular pressure: Right eye pressure is 22 mmHg. Left eye pressure is 26 mmHg. Measurements were taken using a handheld tonometer.    Extraocular Movements: Extraocular movements intact.     Conjunctiva/sclera: Conjunctivae normal.     Pupils: Pupils are equal, round, and reactive to light.     Left eye: Fluorescein uptake present. Seidel exam negative.    Visual Fields: Right eye visual fields normal and left  eye visual fields normal.     Comments: Exquisite pain in the left eye with EOMs.  Pain in the left eye with pupillary exam in the right eye. IOP in the right eye 22, IOP in the left eye complicated by patient's poor tolerance of exam, however IOP was 26 on average.  Global hazy uptake of the fluorescein in the left cornea, no focal uptake.   Neck:     Trachea: Trachea and phonation normal.  Cardiovascular:     Rate and Rhythm: Normal rate and regular rhythm.     Pulses: Normal pulses.     Heart sounds: Normal heart sounds. No murmur heard. Pulmonary:     Effort: Pulmonary effort is normal. No tachypnea, bradypnea, accessory muscle usage, prolonged expiration or respiratory distress.     Breath sounds: Normal breath sounds. No wheezing or rales.  Chest:      Chest wall: No mass, lacerations, deformity, swelling, tenderness, crepitus or edema.  Abdominal:     General: Bowel sounds are normal. There is no distension.     Palpations: Abdomen is soft.     Tenderness: There is no abdominal tenderness. There is no right CVA tenderness, left CVA tenderness, guarding or rebound.  Musculoskeletal:        General: No deformity.     Cervical back: Normal range of motion and neck supple.     Right lower leg: No edema.     Left lower leg: No edema.  Lymphadenopathy:     Cervical: No cervical adenopathy.  Skin:    General: Skin is warm and dry.  Neurological:     General: No focal deficit present.     Mental Status: She is alert and oriented to person, place, and time. Mental status is at baseline.     GCS: GCS eye subscore is 4. GCS verbal subscore is 5. GCS motor subscore is 6.     Cranial Nerves: No dysarthria or facial asymmetry.     Sensory: Sensation is intact.     Motor: Motor function is intact.     Gait: Gait is intact.  Psychiatric:        Mood and Affect: Mood normal.     ED Results / Procedures / Treatments   Labs (all labs ordered are listed, but only abnormal results are displayed) Labs Reviewed  COMPREHENSIVE METABOLIC PANEL - Abnormal; Notable for the following components:      Result Value   Glucose, Bld 131 (*)    All other components within normal limits  CBC WITH DIFFERENTIAL/PLATELET  SEDIMENTATION RATE  C-REACTIVE PROTEIN    EKG None  Radiology No results found.  Procedures Procedures    Medications Ordered in ED Medications  tetracaine (PONTOCAINE) 0.5 % ophthalmic solution 1 drop (has no administration in time range)  fluorescein ophthalmic strip 1 strip (has no administration in time range)  fentaNYL (SUBLIMAZE) injection 50 mcg (50 mcg Intravenous Given 10/25/22 0545)    ED Course/ Medical Decision Making/ A&P                           Medical Decision Making 62 year old female present with  painful vision loss in the left eye.  Hypertensive on intake, improved after analgesia.  Vital signs are normal.  Cardiopulmonary abdominal exams are benign.  Ocular exam as above.  Differential diagnosis includes limited to ocular neuritis, acute angle-closure glaucoma, anterior uveitis, corneal ulcer, endophthalmitis, zoster ophthalmicus.  Amount  and/or Complexity of Data Reviewed Labs: ordered.    Details:  CBC without leukocytosis or anemia, CMP unremarkable.  CRP and sed rate pending.  Radiology: ordered.  Risk Prescription drug management.   Care of this patient signed out to oncoming ED provider Domenic Moras, PA-C time of shift change.  All pertinent HPI, physical exam and laboratory findings were discussed with him prior to my partner.  Patient will likely require ophthalmology consult pending imaging.  Charliene  voiced understanding of her medical evaluation and treatment plan. Each of their questions answered to their expressed satisfaction.   This chart was dictated using voice recognition software, Dragon. Despite the best efforts of this provider to proofread and correct errors, errors may still occur which can change documentation meaning.          Final Clinical Impression(s) / ED Diagnoses Final diagnoses:  None    Rx / DC Orders ED Discharge Orders     None         Emeline Darling, PA-C 10/25/22 0704    Maudie Flakes, MD 10/26/22 [phone]

## 2022-10-25 NOTE — ED Provider Notes (Signed)
Received signout from previous provider, please see her note for complete H&P.  This is a 62 year old female presenting complaining of acute onset of severe left eye pain and light sensitivity which started a day ago.  On initial exam patient exhibit discomfort to her left eye with extraocular movement.  She has global haziness uptake of the fluorescein on the left cornea without focal uptake and her intraocular pressure was 26 in the left eye and 22 in the right eye.  I have viewed interpreted labs and imaging and agree with radiologist interpretation.  Normal WBC, normal H&H, electrolyte panels are reassuring, mildly elevated sed rate of 24, normal CRP.  Had CT scan as well as CT scan of the orbit did not show any concerning finding.  Please note CT scan of the orbit is without contrast.  Patient initial set of vitals was concerning with a blood pressure of 230/95 and heart rate of 108.  This is likely in the setting of eye pain.  After receiving opiate pain medication and eyedrops, she became much more comfortable and blood pressure improved to 148/72 with a heart rate of 78.  I appreciate consultation from on-call ophthalmologist Dr. Manuella Ghazi who recommend patient to be seen in his office for further evaluation and management.  BP (!) 167/97   Pulse 78   Temp 98 F (36.7 C) (Oral)   Resp 18   Ht '5\' 2"'$  (1.575 m)   Wt 108.9 kg   LMP 03/21/2000   SpO2 100%   BMI 43.90 kg/m   Results for orders placed or performed during the hospital encounter of 10/25/22  CBC with Differential  Result Value Ref Range   WBC 7.3 4.0 - 10.5 K/uL   RBC 4.76 3.87 - 5.11 MIL/uL   Hemoglobin 13.7 12.0 - 15.0 g/dL   HCT 42.7 36.0 - 46.0 %   MCV 89.7 80.0 - 100.0 fL   MCH 28.8 26.0 - 34.0 pg   MCHC 32.1 30.0 - 36.0 g/dL   RDW 12.3 11.5 - 15.5 %   Platelets 320 150 - 400 K/uL   nRBC 0.0 0.0 - 0.2 %   Neutrophils Relative % 43 %   Neutro Abs 3.2 1.7 - 7.7 K/uL   Lymphocytes Relative 46 %   Lymphs Abs 3.3  0.7 - 4.0 K/uL   Monocytes Relative 8 %   Monocytes Absolute 0.6 0.1 - 1.0 K/uL   Eosinophils Relative 3 %   Eosinophils Absolute 0.2 0.0 - 0.5 K/uL   Basophils Relative 0 %   Basophils Absolute 0.0 0.0 - 0.1 K/uL   Immature Granulocytes 0 %   Abs Immature Granulocytes 0.02 0.00 - 0.07 K/uL  Comprehensive metabolic panel  Result Value Ref Range   Sodium 139 135 - 145 mmol/L   Potassium 4.0 3.5 - 5.1 mmol/L   Chloride 109 98 - 111 mmol/L   CO2 23 22 - 32 mmol/L   Glucose, Bld 131 (H) 70 - 99 mg/dL   BUN 12 8 - 23 mg/dL   Creatinine, Ser 0.96 0.44 - 1.00 mg/dL   Calcium 9.3 8.9 - 10.3 mg/dL   Total Protein 7.4 6.5 - 8.1 g/dL   Albumin 3.9 3.5 - 5.0 g/dL   AST 20 15 - 41 U/L   ALT 17 0 - 44 U/L   Alkaline Phosphatase 72 38 - 126 U/L   Total Bilirubin 0.5 0.3 - 1.2 mg/dL   GFR, Estimated >60 >60 mL/min   Anion gap 7 5 -  15  Sedimentation rate  Result Value Ref Range   Sed Rate 24 (H) 0 - 22 mm/hr  C-reactive protein  Result Value Ref Range   CRP 0.6 <1.0 mg/dL   CT Head Wo Contrast  Result Date: 10/25/2022 CLINICAL DATA:  Provided history: Vision loss, monocular. Ocular pain. Severe left eye pain. Photophobia. Additional history provided: Pain and photophobia left eye for 2 days. EXAM: CT HEAD AND ORBITS WITHOUT CONTRAST TECHNIQUE: Contiguous axial images were obtained from the base of the skull through the vertex without contrast. Multidetector CT imaging of the orbits was performed using the standard protocol without intravenous contrast. RADIATION DOSE REDUCTION: This exam was performed according to the departmental dose-optimization program which includes automated exposure control, adjustment of the mA and/or kV according to patient size and/or use of iterative reconstruction technique. COMPARISON:  No pertinent prior exams available for comparison. FINDINGS: CT HEAD FINDINGS Brain: Cerebral volume is normal. 4 mm rounded dural-based calcified focus overlying the right  parietooccipital lobes. This may simply reflect dural calcification. However, a tiny calcified meningioma cannot be excluded. There is no acute intracranial hemorrhage. No demarcated cortical infarct. No extra-axial fluid collection. No midline shift. Vascular: No hyperdense vessel. Atherosclerotic calcifications Skull: No fracture or aggressive osseous lesion. CT ORBITS FINDINGS Orbits: The globes are normal in size and contour. The extraocular muscles, optic nerve sheath complexes and lacrimal glands are symmetric and unremarkable on this non-contrast examination. Visualized sinuses: 3.4 cm mucous retention cyst versus polyp within the inferior right maxillary sinus. Minimal mucosal thickening within the left maxillary sinus, bilateral ethmoid air cells and left frontal sinus. Soft tissues: The imaged maxillofacial soft tissues are unremarkable IMPRESSION: CT head: 1. No acute intracranial hemorrhage or acute infarct. 2. 4 mm rounded dural calcification versus tiny calcified meningioma overlying the right parietooccipital lobes. CT orbits: 1. Unremarkable non-contrast CT appearance of the orbits. 2. Paranasal sinus disease, as described. Electronically Signed   By: Kellie Simmering D.O.   On: 10/25/2022 08:18   CT Orbits Wo Contrast  Result Date: 10/25/2022 CLINICAL DATA:  Provided history: Vision loss, monocular. Ocular pain. Severe left eye pain. Photophobia. Additional history provided: Pain and photophobia left eye for 2 days. EXAM: CT HEAD AND ORBITS WITHOUT CONTRAST TECHNIQUE: Contiguous axial images were obtained from the base of the skull through the vertex without contrast. Multidetector CT imaging of the orbits was performed using the standard protocol without intravenous contrast. RADIATION DOSE REDUCTION: This exam was performed according to the departmental dose-optimization program which includes automated exposure control, adjustment of the mA and/or kV according to patient size and/or use of  iterative reconstruction technique. COMPARISON:  No pertinent prior exams available for comparison. FINDINGS: CT HEAD FINDINGS Brain: Cerebral volume is normal. 4 mm rounded dural-based calcified focus overlying the right parietooccipital lobes. This may simply reflect dural calcification. However, a tiny calcified meningioma cannot be excluded. There is no acute intracranial hemorrhage. No demarcated cortical infarct. No extra-axial fluid collection. No midline shift. Vascular: No hyperdense vessel. Atherosclerotic calcifications Skull: No fracture or aggressive osseous lesion. CT ORBITS FINDINGS Orbits: The globes are normal in size and contour. The extraocular muscles, optic nerve sheath complexes and lacrimal glands are symmetric and unremarkable on this non-contrast examination. Visualized sinuses: 3.4 cm mucous retention cyst versus polyp within the inferior right maxillary sinus. Minimal mucosal thickening within the left maxillary sinus, bilateral ethmoid air cells and left frontal sinus. Soft tissues: The imaged maxillofacial soft tissues are unremarkable IMPRESSION: CT head: 1. No  acute intracranial hemorrhage or acute infarct. 2. 4 mm rounded dural calcification versus tiny calcified meningioma overlying the right parietooccipital lobes. CT orbits: 1. Unremarkable non-contrast CT appearance of the orbits. 2. Paranasal sinus disease, as described. Electronically Signed   By: Kellie Simmering D.O.   On: 10/25/2022 08:18   PCV ECHOCARDIOGRAM COMPLETE  Result Date: 10/16/2022 Echocardiogram 10/16/2022: Left ventricle cavity is normal in size and wall thickness. Normal global wall motion. Normal LV systolic function with EF 61%. Doppler evidence of grade I (impaired) diastolic dysfunction, normal LAP. Trileaflet aortic valve with mild aortic valve leaflet calcification, aortic sclerosis. Mild (Grade I) mitral regurgitation. Mild tricuspid regurgitation. No evidence of pulmonary hypertension.   PCV  MYOCARDIAL PERFUSION WO LEXISCAN  Result Date: 10/15/2022 Low risk study exercise Sestamibi stress test 10/15/2022: Exercise nuclear stress test was performed using Bruce protocol.  1 Day Rest and Stress images. Hypertensive response to exercise: Yes (rest 170/90, peak 220/90) Exercise time 3 minutes 06 seconds on Bruce protocol, achieved 4.74METS, 88% of age-predicted maximum heart rate (APMHR). Stress ECG negative for ischemia.  Normal myocardial perfusion without convincing evidence of reversible myocardial ischemia or prior infarct. Left ventricular cavity size is normal, LVEF 59%, wall thickness and wall motion preserved. Low risk study. No prior studies available for comparison.      Domenic Moras, PA-C 10/25/22 6701    Isla Pence, MD 10/25/22 [phone]

## 2022-10-25 NOTE — ED Triage Notes (Signed)
Pt c/o pain and photophobia in left eye since yesterday. Pt states that she is able to see but it is painful.

## 2022-10-25 NOTE — Discharge Instructions (Signed)
Please go straight to the ophthalmologist office after leaving here to be evaluated further for your condition.

## 2022-11-05 ENCOUNTER — Ambulatory Visit
Admission: RE | Admit: 2022-11-05 | Discharge: 2022-11-05 | Disposition: A | Discharge status: Home / Self Care | Payer: No Typology Code available for payment source | Source: Ambulatory Visit | Attending: Cardiology | Admitting: Cardiology

## 2022-11-05 DIAGNOSIS — I209 Angina pectoris, unspecified: Secondary | ICD-10-CM

## 2022-11-05 NOTE — Progress Notes (Signed)
Coronary calcium score 11/05/2022: LM 1.98 LAD 4.8 LCx 43.8 RCA 69.3 Total Agatston score 120, MESA database percentile: 92. Ascending and descending thoracic aortic measurements are normal.  Visualized mediastinal structures are normal.  Calcification or clip in the right breast tissue.  3 mm subpleural nodule in the left upper lobe. Per Fleischner Society Guidelines, if patient is low risk for malignancy, no routine follow-up imaging is recommended. If patient is high risk for malignancy, a non-contrast Chest CT at 12 months is optional. Subtle hazy densities in the medial lower lobes likely associated with chronic changes or atelectasis.

## 2022-11-09 ENCOUNTER — Ambulatory Visit: Payer: Federal, State, Local not specified - PPO | Admitting: Cardiology

## 2022-11-09 ENCOUNTER — Encounter: Payer: Self-pay | Admitting: Cardiology

## 2022-11-09 VITALS — BP 168/86 | HR 86 | Ht 62.0 in | Wt 233.0 lb

## 2022-11-09 DIAGNOSIS — I10 Essential (primary) hypertension: Secondary | ICD-10-CM

## 2022-11-09 DIAGNOSIS — R931 Abnormal findings on diagnostic imaging of heart and coronary circulation: Secondary | ICD-10-CM

## 2022-11-09 DIAGNOSIS — G4733 Obstructive sleep apnea (adult) (pediatric): Secondary | ICD-10-CM

## 2022-11-09 DIAGNOSIS — E7800 Pure hypercholesterolemia, unspecified: Secondary | ICD-10-CM

## 2022-11-09 DIAGNOSIS — E559 Vitamin D deficiency, unspecified: Secondary | ICD-10-CM

## 2022-11-09 DIAGNOSIS — I209 Angina pectoris, unspecified: Secondary | ICD-10-CM

## 2022-11-09 MED ORDER — OLMESARTAN MEDOXOMIL-HCTZ 20-12.5 MG PO TABS: 1.0000 | ORAL_TABLET | ORAL | 1 refills | Status: DC

## 2022-11-09 MED ORDER — DIALYVITE VITAMIN D 5000 125 MCG (5000 UT) PO CAPS: 5000.0000 [IU] | ORAL_CAPSULE | Freq: Every day | ORAL | 0 refills | Status: DC

## 2022-11-09 MED ORDER — EZETIMIBE-SIMVASTATIN 10-40 MG PO TABS: 1.0000 | ORAL_TABLET | Freq: Every day | ORAL | 2 refills | Status: DC

## 2022-11-09 MED ORDER — METOPROLOL SUCCINATE ER 100 MG PO TB24: 100.0000 mg | ORAL_TABLET | Freq: Every evening | ORAL | 2 refills | Status: DC

## 2022-11-09 NOTE — Progress Notes (Signed)
Primary Physician/Referring:  Aura Dials, PA-C  Patient ID: Anita Hughes, female    DOB: 1960/08/17, 62 y.o.   MRN: [id number]  Chief Complaint  Patient presents with   Chest Pain   Follow-up   Results   HPI:    Anita Hughes  is a 62 y.o. African-American female patient with history of PSVT first diagnosed in 2018, morbid obesity with mild OSA not on CPAP, prediabetes mellitus, hypertension, hyperlipidemia, history of GERD, hiatal hernia who had seen her about 6 to 8 months ago, referred back to me for evaluation of chest pain.  I had seen her 6 weeks ago.  Chest pain symptoms with exertional activity under the left breast area very suggestive of angina pectoris and also chest pain was brought on by stress.  He underwent stress testing, echocardiogram and coronary CT calcium scoring and presents for follow-up.  Since the addition of medication, she has not had any chest pain and has resumed her activities as usual.  I had started her on metoprolol succinate 50 mg daily along with Vytorin 10/20 mg in the evening.  Except for chronic dyspnea she has no specific complaints today.  Past Medical History:  Diagnosis Date   Colon polyps    GERD (gastroesophageal reflux disease)    Hemorrhoids    Hiatal hernia    Hyperlipidemia    Hypertension    Obesity    SVT (supraventricular tachycardia)    Past Surgical History:  Procedure Laterality Date   ABDOMINAL HYSTERECTOMY  01/01/2000   FIBROIDS   BREAST BIOPSY Left 05/24/2022   BREAST LUMPECTOMY WITH RADIOACTIVE SEED LOCALIZATION Left 07/26/2022   Procedure: LEFT BREAST LUMPECTOMY WITH RADIOACTIVE SEED LOCALIZATION;  Surgeon: Erroll Luna, MD;  Location: [facility];  Service: General;  Laterality: Left;   CESAREAN SECTION  12/31/1988   Family History  Problem Relation Age of Onset   Hypertension Mother    Heart attack Father 44       2 heart attacks   Hypertension Sister    Hypertension Sister     Hypertension Sister    Hypertension Brother     Social History   Tobacco Use   Smoking status: Never   Smokeless tobacco: Never  Substance Use Topics   Alcohol use: No    Alcohol/week: 0.0 standard drinks of alcohol   Marital Status: Married  ROS  Review of Systems  Cardiovascular:  Positive for chest pain and dyspnea on exertion. Negative for leg swelling and palpitations.  Respiratory:  Positive for snoring.   Gastrointestinal:  Negative for melena.   Objective  Blood pressure (!) 168/86, pulse 86, height '5\' 2"'$  (1.575 m), weight 233 lb (105.7 kg), last menstrual period 03/21/2000, SpO2 98 %. Body mass index is 42.62 kg/m.     11/09/2022   11:25 AM 10/25/2022    8:45 AM 10/25/2022    5:45 AM  Vitals with BMI  Height '5\' 2"'$     Weight 233 lbs    BMI 78.24    Systolic 235 361 443  Diastolic 86 97 93  Pulse 86 78 77    Physical Exam Constitutional:      Appearance: She is morbidly obese.  Neck:     Vascular: No carotid bruit or JVD.  Cardiovascular:     Rate and Rhythm: Normal rate and regular rhythm.     Pulses: Intact distal pulses.          Dorsalis pedis pulses are 1+ on  the right side and 1+ on the left side.       Posterior tibial pulses are 2+ on the right side and 2+ on the left side.     Heart sounds: Normal heart sounds. No murmur heard.    No gallop.  Pulmonary:     Effort: Pulmonary effort is normal.     Breath sounds: Normal breath sounds.  Abdominal:     General: Bowel sounds are normal.     Palpations: Abdomen is soft.  Musculoskeletal:     Right lower leg: No edema.     Left lower leg: No edema.      Laboratory examination:   Recent Labs    10/25/22 0535  NA 139  K 4.0  CL 109  CO2 23  GLUCOSE 131*  BUN 12  CREATININE 0.96  CALCIUM 9.3  GFRNONAA >60    estimated creatinine clearance is 70.2 mL/min (by C-G formula based on SCr of 0.96 mg/dL).     Latest Ref Rng & Units 10/25/2022    5:35 AM 03/20/2021    3:42 PM 12/11/2020     9:11 AM  CMP  Glucose 70 - 99 mg/dL 131   166   BUN 8 - 23 mg/dL 12   11   Creatinine 0.44 - 1.00 mg/dL 0.96   0.86   Sodium 135 - 145 mmol/L 139   138   Potassium 3.5 - 5.1 mmol/L 4.0   4.0   Chloride 98 - 111 mmol/L 109   106   CO2 22 - 32 mmol/L 23   19   Calcium 8.9 - 10.3 mg/dL 9.3   9.1   Total Protein 6.5 - 8.1 g/dL 7.4  7.9  7.3   Total Bilirubin 0.3 - 1.2 mg/dL 0.5  0.4  0.6   Alkaline Phos 38 - 126 U/L 72  68  69   AST 15 - 41 U/L 20  25  78   ALT 0 - 44 U/L 17  20  60       Latest Ref Rng & Units 10/25/2022    5:35 AM 09/14/2022    9:49 AM 12/11/2020    9:11 AM  CBC  WBC 4.0 - 10.5 K/uL 7.3  5.8  7.9   Hemoglobin 12.0 - 15.0 g/dL 13.7  13.5  14.3   Hematocrit 36.0 - 46.0 % 42.7  41.2  46.1   Platelets 150 - 400 K/uL 320  334  331     Lipid Panel Recent Labs    09/14/22 0949  CHOL 254*  TRIG 102  LDLCALC 185*  HDL 51    Lipid Panel     Component Value Date/Time   CHOL 254 (H) 09/14/2022 0949   TRIG 102 09/14/2022 0949   HDL 51 09/14/2022 0949   CHOLHDL 4.4 05/09/2018 0719   CHOLHDL 4.9 03/21/2012 1528   VLDL 20 03/21/2012 1528   LDLCALC 185 (H) 09/14/2022 0949   LABVLDL 18 09/14/2022 0949     HEMOGLOBIN A1C Lab Results  Component Value Date   HGBA1C 6.2 (H) 09/14/2022   TSH Recent Labs    09/14/22 0949  TSH 1.680     External labs:   Labs 06/27/2021:  Sodium 141, K4.5, creatinine 0.99, GFR 65, LFTs normal,  LDL 199, HDL 56, TG 112, TC 275,  HgbA1c 6%, TSH 1.4.  Medications and allergies   Allergies  Allergen Reactions   Pravastatin Other (See Comments)    Shortness of breath  Rosuvastatin Other (See Comments)    Patient wakes up with short breaths/hard to catch breaths when taking this med.     Medication list after today's encounter    Current Outpatient Medications:    aspirin (ASPIRIN CHILDRENS) 81 MG chewable tablet, Chew 1 tablet (81 mg total) by mouth daily., Disp: , Rfl:    cyclopentolate (CYCLODRYL,CYCLOGYL) 1 %  ophthalmic solution, Place 1 drop into both eyes 3 (three) times daily., Disp: , Rfl:    diltiazem (CARDIZEM CD) 240 MG 24 hr capsule, TAKE 1 CAPSULE BY MOUTH EVERY DAY, Disp: 30 capsule, Rfl: 2   ezetimibe-simvastatin (VYTORIN) 10-20 MG tablet, Take 0.5 tablets by mouth daily at 6 PM. PATIENT taking half, Disp: 30 tablet, Rfl: 2   FLUoxetine (PROZAC) 20 MG capsule, Take 20 mg by mouth as needed., Disp: , Rfl:    nitroGLYCERIN (NITROSTAT) 0.4 MG SL tablet, Place 1 tablet (0.4 mg total) under the tongue every 5 (five) minutes as needed for chest pain., Disp: 90 tablet, Rfl: 3   pantoprazole (PROTONIX) 40 MG tablet, Take 40 mg by mouth daily., Disp: , Rfl:    prednisoLONE acetate (PRED FORTE) 1 % ophthalmic suspension, Place 2 drops into both eyes every 4 (four) hours., Disp: , Rfl:    Vitamin D, Ergocalciferol, (DRISDOL) 1.25 MG (50000 UNIT) CAPS capsule, Take 1 capsule (50,000 Units total) by mouth every 7 (seven) days., Disp: 5 capsule, Rfl: 0   metoprolol succinate (TOPROL-XL) 50 MG 24 hr tablet, Take 1 tablet (50 mg total) by mouth every evening. Take with or immediately following a meal., Disp: 30 tablet, Rfl: 2   Radiology:   Coronary calcium score 11/05/2022: LM 1.98 LAD 4.8 LCx 43.8 RCA 69.3 Total Agatston score 120, MESA database percentile: 92. Ascending and descending thoracic aortic measurements are normal.   Visualized mediastinal structures are normal.  Calcification or clip in the right breast tissue.  3 mm subpleural nodule in the left upper lobe. Per Fleischner Society Guidelines, if patient is low risk for malignancy, no routine follow-up imaging is recommended. If patient is high risk for malignancy, a non-contrast Chest CT at 12 months is optional. Subtle hazy densities in the medial lower lobes likely associated with chronic changes or atelectasis.  Cardiac Studies:   Stress echo from Feb 05, 2020  LV EF: 55-60%. Grade 1 diastolic dysfunction  Stress: Normal functional  capacity - 7.2 mets.  Stress ECG: ECG - No ischemic ST segment changes occurred with stress.  No evidence of inducible ischemia at HR achieved. normal LV fxn and no signif valve dz.  PCV MYOCARDIAL PERFUSION WO LEXISCAN 10/15/2022  Narrative Low risk study exercise Sestamibi stress test 10/15/2022: Exercise nuclear stress test was performed using Bruce protocol. 1 Day Rest and Stress images. Hypertensive response to exercise: Yes (rest 170/90, peak 220/90) Exercise time 3 minutes 06 seconds on Bruce protocol, achieved 4.74METS, 88% of age-predicted maximum heart rate (APMHR). Stress ECG negative for ischemia. Normal myocardial perfusion without convincing evidence of reversible myocardial ischemia or prior infarct. Left ventricular cavity size is normal, LVEF 59%, wall thickness and wall motion preserved. Low risk study. No prior studies available for comparison.   PCV ECHOCARDIOGRAM COMPLETE 10/15/2022  Narrative Echocardiogram 10/16/2022: Left ventricle cavity is normal in size and wall thickness. Normal global wall motion. Normal LV systolic function with EF 61%. Doppler evidence of grade I (impaired) diastolic dysfunction, normal LAP. Trileaflet aortic valve with mild aortic valve leaflet calcification, aortic sclerosis. Mild (Grade I) mitral regurgitation.  Mild tricuspid regurgitation. No evidence of pulmonary hypertension.     EKG:   EKG 08/14/2022: Normal sinus rhythm at a rate of 84 bpm, left atrial enlargement, otherwise normal EKG.  Compared to 10/03/2021, no change.  EKG 12/11/2020: SVT at the rate of 171 bpm.  No evidence of ischemia.  Assessment   No diagnosis found.    There are no discontinued medications.   No orders of the defined types were placed in this encounter.   No orders of the defined types were placed in this encounter.  Recommendations:   Anita Hughes is a 62 y.o. African-American female patient with history of PSVT first diagnosed in  2018, morbid obesity with mild OSA not on CPAP, prediabetes mellitus, hypertension, hyperlipidemia, history of GERD, hiatal hernia who had seen her about 6 to 8 months ago, referred back to me for evaluation of chest pain, I seen her 6 weeks ago.  1. Angina pectoris ([place]) Since being on metoprolol and also Vytorin 10/20 mg, she has not had any recurrence of exertional chest discomfort.  She is also on aspirin.  Continue the same for now with regard to metoprolol succinate however in view of hypertensive blood pressure response I will increase the dose to 100 mg daily.  2. Elevated coronary artery calcium score Total score 120, MESA database percentile 92 11/05/22 Markedly elevated coronary calcium score places her in the 92nd percentile.  I have explained this to the patient regarding elevated cardiovascular risk.  Agrees to primary/secondary prevention is indicated.  She will continue with aspirin 81 mg daily, continue with the beta-blocker and will also target her lipids.  3. Primary hypertension Blood pressure is uncontrolled.  I will discontinue diltiazem, increase the dose of the metoprolol succinate to 100 mg in the evening, and olmesartan HCT 20/12.5 mg in the morning.  She will obtain BMP along with lipid profile testing in 4 to 6 weeks prior to my office visit.  4. Pure hypercholesterolemia She probably will need PCSK9 inhibitors.  Aggressive lifestyle modification discussed with the patient.  Weight loss discussed with the patient.  I will discontinue Vytorin 10/20 mg that she is taking which she is tolerating, will increase it to 10/40 mg in the evening.  On further questioning, the intolerance to Crestor and Lipitor appears to be very minor with regard to mild dyspnea that last just a few minutes.  If need be we may have to consider switching her to high intensity high-dose Lipitor or Crestor in future along with Zetia.  5. Hypovitaminosis D She had markedly reduced vitamin D levels, she  has completed taking 5 weeks of 50,000 units, advised her to start 5000 units of vitamin D, obtain vitamin D levels again with her lipid profile testing.  6. OSA (obstructive sleep apnea) OSA may be playing a significant role in her hyperglycemia, uncontrolled hypertension and also obesity.  She may need repeat sleep study, it was done many years ago.  I will request Roe Coombs, PA-C to consider this.  Total time spent 40 minutes with counseling and also medication changes.    Adrian Prows, MD, [facility] 11/09/2022, 11:33 AM Office: [phone]

## 2022-12-12 ENCOUNTER — Other Ambulatory Visit: Payer: Federal, State, Local not specified - PPO

## 2022-12-13 ENCOUNTER — Other Ambulatory Visit: Payer: Self-pay | Admitting: Cardiology

## 2022-12-17 ENCOUNTER — Ambulatory Visit: Payer: Federal, State, Local not specified - PPO

## 2023-01-15 ENCOUNTER — Other Ambulatory Visit: Payer: Self-pay | Admitting: Cardiology

## 2023-01-15 DIAGNOSIS — E7800 Pure hypercholesterolemia, unspecified: Secondary | ICD-10-CM

## 2023-01-15 DIAGNOSIS — I10 Essential (primary) hypertension: Secondary | ICD-10-CM

## 2023-01-15 DIAGNOSIS — R931 Abnormal findings on diagnostic imaging of heart and coronary circulation: Secondary | ICD-10-CM

## 2023-01-15 DIAGNOSIS — I209 Angina pectoris, unspecified: Secondary | ICD-10-CM

## 2023-07-11 ENCOUNTER — Encounter: Payer: Self-pay | Admitting: Cardiology

## 2023-07-11 NOTE — Telephone Encounter (Signed)
From pt

## 2023-08-14 NOTE — Telephone Encounter (Signed)
No action done

## 2023-09-21 ENCOUNTER — Other Ambulatory Visit: Payer: Self-pay | Admitting: Cardiology

## 2023-09-23 ENCOUNTER — Telehealth: Payer: Self-pay | Admitting: Cardiology

## 2023-09-23 MED ORDER — OLMESARTAN MEDOXOMIL-HCTZ 20-12.5 MG PO TABS: 1.0000 | ORAL_TABLET | ORAL | 3 refills | Status: DC

## 2023-09-23 NOTE — Telephone Encounter (Signed)
*  STAT* If patient is at the pharmacy, call can be transferred to refill team.   1. Which medications need to be refilled? (please list name of each medication and dose if known) olmesartan-hydrochlorothiazide (BENICAR HCT) 20-12.5 MG tablet    2. Would you like to learn more about the convenience, safety, & potential cost savings by using the [facility]? No   3. Are you open to using the [facility] : NO   4. Which pharmacy/location (including street and city if local pharmacy) is medication to be sent to?  [facility] - [place], [place] - [address]     5. Do they need a 30 day or 90 day supply? 90

## 2023-12-09 ENCOUNTER — Encounter: Payer: Self-pay | Admitting: Cardiology

## 2023-12-09 NOTE — Telephone Encounter (Signed)
error 

## 2023-12-21 ENCOUNTER — Encounter (HOSPITAL_COMMUNITY): Payer: Self-pay | Admitting: *Deleted

## 2023-12-21 ENCOUNTER — Other Ambulatory Visit: Payer: Self-pay

## 2023-12-21 ENCOUNTER — Ambulatory Visit (HOSPITAL_COMMUNITY)
Admission: EM | Admit: 2023-12-21 | Discharge: 2023-12-21 | Disposition: A | Discharge status: Home / Self Care | Payer: Federal, State, Local not specified - PPO | Attending: Internal Medicine | Admitting: Internal Medicine

## 2023-12-21 DIAGNOSIS — J069 Acute upper respiratory infection, unspecified: Secondary | ICD-10-CM | POA: Diagnosis not present

## 2023-12-21 DIAGNOSIS — B001 Herpesviral vesicular dermatitis: Secondary | ICD-10-CM

## 2023-12-21 DIAGNOSIS — R051 Acute cough: Secondary | ICD-10-CM

## 2023-12-21 MED ORDER — PREDNISONE 10 MG PO TABS: 40.0000 mg | ORAL_TABLET | Freq: Every day | ORAL | 0 refills | Status: AC

## 2023-12-21 MED ORDER — VALACYCLOVIR HCL 1 G PO TABS: 2.0000 g | ORAL_TABLET | Freq: Two times a day (BID) | ORAL | 0 refills | Status: AC

## 2023-12-21 MED ORDER — PROMETHAZINE-DM 6.25-15 MG/5ML PO SYRP: 5.0000 mL | ORAL_SOLUTION | Freq: Three times a day (TID) | ORAL | 0 refills | Status: DC | PRN

## 2023-12-21 MED ORDER — AZITHROMYCIN 250 MG PO TABS: ORAL_TABLET | ORAL | 0 refills | Status: DC

## 2023-12-21 NOTE — ED Triage Notes (Signed)
C/O productive cough over past 2 wks without any improvement. C/O some transient nausea. Denies fevers. Also C/O painful oral lesions onset last wk - states has hx of them, "esp with colds". Has tried taking Vit B12, and swishing Mylanta, Pepto, H2O2 without relief.

## 2023-12-21 NOTE — Discharge Instructions (Addendum)
Upper respiratory infection that has failed conservative management. Will treat with the following:  Azithromycin 250mg  take 2 tablets today, then 1 daily for 4 more days Prednisone 40mg  daily for 5 days, take this in the morning Promethazine DM every 8 hours as needed for cough. Use caution as this medication can make you sleepy For the fever blisters, will treat with valtrex 2 grams every 12 hours for 1 day.  Rest and hydrate Return to urgent care or PCP if symptoms worsen or fail to resolve.

## 2023-12-21 NOTE — ED Provider Notes (Signed)
[facility]    CSN: [id number] Arrival date & time: 12/21/23  1006      History   Chief Complaint No chief complaint on file.   HPI Anita Hughes is a 63 y.o. female.   63 yr old female who is present to urgent care with complaints of cough and mouth sores. These symptoms have now been going on for about 10-14 days. She did a Video visit with [facility] urgent care on 12/16/23 for same symptoms. At that time symptoms had been going on for about 4 days. Tested herself for covid and was negative. Was told it was likely viral bronchitis and to take mucinex DM and follow up if symptoms worsened or failed to resolve.  Her symptoms have not improved.  She denies fevers, chills.  She has had some nausea and vomiting associated with coughing.  She has also developed mouth sores since then.  She reports that she gets these whenever she is sick and has had them since she was very young.  She denies taking any medication for them in the past.     Past Medical History:  Diagnosis Date   Colon polyps    GERD (gastroesophageal reflux disease)    Hemorrhoids    Hiatal hernia    Hyperlipidemia    Hypertension    Obesity    SVT (supraventricular tachycardia)     Patient Active Problem List   Diagnosis Date Noted   Elevated coronary artery calcium score Total score 120, MESA database percentile 92 11/05/22 11/09/2022   Pure hypercholesterolemia 07/09/2022   Viral URI with cough 02/07/2018   Supraventricular tachycardia (HCC) 11/28/2017   Pre-diabetes 11/15/2015   Health care maintenance 11/15/2015   Primary hypertension    Obesity 03/09/2008   ESOPHAGEAL STRICTURE 03/09/2008   Hiatal hernia with gastroesophageal reflux 03/09/2008    Past Surgical History:  Procedure Laterality Date   ABDOMINAL HYSTERECTOMY  01/01/2000   FIBROIDS   BREAST BIOPSY Left 05/24/2022   BREAST LUMPECTOMY WITH RADIOACTIVE SEED LOCALIZATION Left 07/26/2022   Procedure: LEFT BREAST LUMPECTOMY  WITH RADIOACTIVE SEED LOCALIZATION;  Surgeon: Harriette Bouillon, MD;  Location: [facility];  Service: General;  Laterality: Left;   CESAREAN SECTION  12/31/1988    OB History     Gravida  4   Para  4   Term  4   Preterm      AB      Living  4      SAB      IAB      Ectopic      Multiple      Live Births  4            Home Medications    Prior to Admission medications   Medication Sig Start Date End Date Taking? Authorizing Provider  aspirin (ASPIRIN CHILDRENS) 81 MG chewable tablet Chew 1 tablet (81 mg total) by mouth daily. 09/14/22   Yates Decamp, MD  Cholecalciferol (DIALYVITE VITAMIN D 5000) 125 MCG (5000 UT) capsule Take 1 capsule (5,000 Units total) by mouth daily. 11/09/22   Yates Decamp, MD  cyclopentolate (CYCLODRYL,CYCLOGYL) 1 % ophthalmic solution Place 1 drop into both eyes 3 (three) times daily. 10/25/22   [provider]  ezetimibe-simvastatin (VYTORIN) 10-40 MG tablet TAKE 1 TABLET BY MOUTH DAILY AT 6 PM 01/15/23   Yates Decamp, MD  FLUoxetine (PROZAC) 20 MG capsule Take 20 mg by mouth as needed. 07/27/21   [provider]  metoprolol succinate (TOPROL-XL) 100 MG 24 hr tablet TAKE 1 TABLET BY MOUTH EVERY EVENING. TAKE WITH OR immediately following A meal 01/15/23   Yates Decamp, MD  nitroGLYCERIN (NITROSTAT) 0.4 MG SL tablet Place 1 tablet (0.4 mg total) under the tongue every 5 (five) minutes as needed for chest pain. 09/14/22 12/13/22  Yates Decamp, MD  olmesartan-hydrochlorothiazide (BENICAR HCT) 20-12.5 MG tablet Take 1 tablet by mouth every morning. 09/23/23   Yates Decamp, MD  pantoprazole (PROTONIX) 40 MG tablet Take 40 mg by mouth daily. 06/16/19   [provider]  prednisoLONE acetate (PRED FORTE) 1 % ophthalmic suspension Place 2 drops into both eyes every 4 (four) hours. 11/01/22   [provider]    Family History Family History  Problem Relation Age of Onset   Hypertension Mother    Heart attack Father 64        2 heart attacks   Hypertension Sister    Hypertension Sister    Hypertension Sister    Hypertension Brother     Social History Social History   Tobacco Use   Smoking status: Never   Smokeless tobacco: Never  Vaping Use   Vaping status: Never Used  Substance Use Topics   Alcohol use: No    Alcohol/week: 0.0 standard drinks of alcohol   Drug use: No     Allergies   Pravastatin and Rosuvastatin   Review of Systems Review of Systems  Constitutional:  Negative for chills and fever.  HENT:  Negative for ear pain and sore throat.   Eyes:  Negative for pain and visual disturbance.  Respiratory:  Positive for cough. Negative for shortness of breath.   Cardiovascular:  Negative for chest pain and palpitations.  Gastrointestinal:  Negative for abdominal pain and vomiting.  Genitourinary:  Negative for dysuria and hematuria.  Musculoskeletal:  Negative for arthralgias and back pain.  Skin:  Negative for color change and rash.  Neurological:  Negative for seizures and syncope.  All other systems reviewed and are negative.    Physical Exam Triage Vital Signs ED Triage Vitals  Encounter Vitals Group     BP      Systolic BP Percentile      Diastolic BP Percentile      Pulse      Resp      Temp      Temp src      SpO2      Weight      Height      Head Circumference      Peak Flow      Pain Score      Pain Loc      Pain Education      Exclude from Growth Chart    No data found.  Updated Vital Signs LMP 03/21/2000   Visual Acuity Right Eye Distance:   Left Eye Distance:   Bilateral Distance:    Right Eye Near:   Left Eye Near:    Bilateral Near:     Physical Exam Vitals and nursing note reviewed.  Constitutional:      General: She is not in acute distress.    Appearance: She is well-developed.  HENT:     Head: Normocephalic and atraumatic.     Right Ear: Tympanic membrane normal.     Left Ear: Tympanic membrane normal.     Nose: Congestion  present.     Mouth/Throat:     Mouth: Mucous membranes are moist.  Pharynx: Posterior oropharyngeal erythema (very mild) present.  Eyes:     Conjunctiva/sclera: Conjunctivae normal.  Cardiovascular:     Rate and Rhythm: Normal rate and regular rhythm.     Heart sounds: No murmur heard. Pulmonary:     Effort: Pulmonary effort is normal. No respiratory distress.     Breath sounds: Normal breath sounds.  Abdominal:     Palpations: Abdomen is soft.     Tenderness: There is no abdominal tenderness.  Musculoskeletal:        General: No swelling.     Cervical back: Neck supple.  Skin:    General: Skin is warm and dry.     Capillary Refill: Capillary refill takes less than 2 seconds.  Neurological:     Mental Status: She is alert.  Psychiatric:        Mood and Affect: Mood normal.      UC Treatments / Results  Labs (all labs ordered are listed, but only abnormal results are displayed) Labs Reviewed - No data to display  EKG   Radiology No results found.  Procedures Procedures (including critical care time)  Medications Ordered in UC Medications - No data to display  Initial Impression / Assessment and Plan / UC Course  I have reviewed the triage vital signs and the nursing notes.  Pertinent labs & imaging results that were available during my care of the patient were reviewed by me and considered in my medical decision making (see chart for details).     Fever blister  Acute upper respiratory infection  Acute cough   Upper respiratory infection that has failed conservative management. Will treat with the following:  Azithromycin 250mg  take 2 tablets today, then 1 daily for 4 more days Prednisone 40mg  daily for 5 days, take this in the morning Promethazine DM every 8 hours as needed for cough. Use caution as this medication can make you sleepy For the fever blisters, will treat with valtrex 2 grams every 12 hours for 1 day.  Rest and hydrate Return to  urgent care or PCP if symptoms worsen or fail to resolve.    Final Clinical Impressions(s) / UC Diagnoses   Final diagnoses:  None   Discharge Instructions   None    ED Prescriptions   None    PDMP not reviewed this encounter.   Landis Martins, [place] 12/21/23 1058

## 2024-01-16 ENCOUNTER — Encounter: Payer: Self-pay | Admitting: *Deleted

## 2024-01-16 ENCOUNTER — Other Ambulatory Visit: Payer: Self-pay | Admitting: Cardiology

## 2024-01-16 ENCOUNTER — Ambulatory Visit: Payer: BC Managed Care – PPO | Attending: Physician Assistant | Admitting: Physician Assistant

## 2024-01-16 VITALS — BP 164/82 | HR 59 | Ht 62.0 in | Wt 236.0 lb

## 2024-01-16 DIAGNOSIS — R931 Abnormal findings on diagnostic imaging of heart and coronary circulation: Secondary | ICD-10-CM | POA: Diagnosis not present

## 2024-01-16 DIAGNOSIS — E559 Vitamin D deficiency, unspecified: Secondary | ICD-10-CM

## 2024-01-16 DIAGNOSIS — I10 Essential (primary) hypertension: Secondary | ICD-10-CM

## 2024-01-16 DIAGNOSIS — E7800 Pure hypercholesterolemia, unspecified: Secondary | ICD-10-CM

## 2024-01-16 DIAGNOSIS — Z0279 Encounter for issue of other medical certificate: Secondary | ICD-10-CM

## 2024-01-16 DIAGNOSIS — I471 Supraventricular tachycardia, unspecified: Secondary | ICD-10-CM

## 2024-01-16 DIAGNOSIS — I209 Angina pectoris, unspecified: Secondary | ICD-10-CM | POA: Diagnosis not present

## 2024-01-16 DIAGNOSIS — G4733 Obstructive sleep apnea (adult) (pediatric): Secondary | ICD-10-CM

## 2024-01-16 MED ORDER — OLMESARTAN MEDOXOMIL 20 MG PO TABS: 10.0000 mg | ORAL_TABLET | Freq: Every day | ORAL | 3 refills | Status: DC

## 2024-01-16 NOTE — Patient Instructions (Signed)
Medication Instructions:  Your physician has recommended you make the following change in your medication:   START Olmesartan 20 mg taking 1/2 tablet daily  *If you need a refill on your cardiac medications before your next appointment, please call your pharmacy*   Lab Work: None ordered  If you have labs (blood work) drawn today and your tests are completely normal, you will receive your results only by: MyChart Message (if you have MyChart) OR A paper copy in the mail If you have any lab test that is abnormal or we need to change your treatment, we will call you to review the results.   Testing/Procedures: Anita Hughes- Long Term Monitor Instructions  Your physician has requested you wear a ZIO patch monitor for 14 days.  This is a single patch monitor. [employer] supplies one patch monitor per enrollment. Additional stickers are not available. Please do not apply patch if you will be having a Nuclear Stress Test,  Echocardiogram, Cardiac CT, MRI, or Chest Xray during the period you would be wearing the  monitor. The patch cannot be worn during these tests. You cannot remove and re-apply the  ZIO XT patch monitor.  Your ZIO patch monitor will be mailed 3 day USPS to your address on file. It may take 3-5 days  to receive your monitor after you have been enrolled.  Once you have received your monitor, please review the enclosed instructions. Your monitor  has already been registered assigning a specific monitor serial # to you.  Billing and Patient Assistance Program Information  We have supplied [employer] with any of your insurance information on file for billing purposes. [employer] offers a sliding scale Patient Assistance Program for patients that do not have  insurance, or whose insurance does not completely cover the cost of the ZIO monitor.  You must apply for the Patient Assistance Program to qualify for this discounted rate.  To apply, please call [employer] at [phone], select option  4, select option 2, ask to apply for  Patient Assistance Program. Meredeth Ide will ask your household income, and how many people  are in your household. They will quote your out-of-pocket cost based on that information.  [employer] will also be able to set up a 57-month, interest-free payment plan if needed.  Applying the monitor   Shave hair from upper left chest.  Hold abrader disc by orange tab. Rub abrader in 40 strokes over the upper left chest as  indicated in your monitor instructions.  Clean area with 4 enclosed alcohol pads. Let dry.  Apply patch as indicated in monitor instructions. Patch will be placed under collarbone on left  side of chest with arrow pointing upward.  Rub patch adhesive wings for 2 minutes. Remove white label marked "1". Remove the white  label marked "2". Rub patch adhesive wings for 2 additional minutes.  While looking in a mirror, press and release button in center of patch. A small green light will  flash 3-4 times. This will be your only indicator that the monitor has been turned on.  Do not shower for the first 24 hours. You may shower after the first 24 hours.  Press the button if you feel a symptom. You will hear a small click. Record Date, Time and  Symptom in the Patient Logbook.  When you are ready to remove the patch, follow instructions on the last 2 pages of Patient  Logbook. Stick patch monitor onto the last page of Patient Logbook.  Place Patient Logbook  in the blue and white box. Use locking tab on box and tape box closed  securely. The blue and white box has prepaid postage on it. Please place it in the mailbox as  soon as possible. Your physician should have your test results approximately 7 days after the  monitor has been mailed back to [facility].  Call [facility] Customer Care at [phone] if you have questions regarding  your ZIO XT patch monitor. Call them immediately if you see an orange light blinking on your  monitor.  If  your monitor falls off in less than 4 days, contact our Monitor department at [phone].  If your monitor becomes loose or falls off after 4 days call [employer] at [phone] for  suggestions on securing your monitor    Follow-Up: At [facility], you and your health needs are our priority.  As part of our continuing mission to provide you with exceptional heart care, we have created designated Provider Care Teams.  These Care Teams include your primary Cardiologist (physician) and Advanced Practice Providers (APPs -  Physician Assistants and Nurse Practitioners) who all work together to provide you with the care you need, when you need it.  We recommend signing up for the patient portal called "MyChart".  Sign up information is provided on this After Visit Summary.  MyChart is used to connect with patients for Virtual Visits (Telemedicine).  Patients are able to view lab/test results, encounter notes, upcoming appointments, etc.  Non-urgent messages can be sent to your provider as well.   To learn more about what you can do with MyChart, go to [email or web address].    Your next appointment:   12 month(s)  Provider:   Yates Decamp, MD     Other Instructions Your physician has requested that you regularly monitor and record your blood pressure readings at home. Please use the same machine at the same time of day to check your readings and record them to bring to your follow-up visit.   Please monitor blood pressures and keep a log of your readings for 2 weeks and send in a mychart message with the readings.    Make sure to check 2 hours after your medications.    AVOID these things for 30 minutes before checking your blood pressure: No Drinking caffeine. No Drinking alcohol. No Eating. No Smoking. No Exercising.   Five minutes before checking your blood pressure: Pee. Sit in a dining chair. Avoid sitting in a soft couch or armchair. Be quiet. Do not talk

## 2024-01-16 NOTE — Progress Notes (Signed)
Cardiology Office Note:  .   Date:  01/16/2024  ID:  MARZIA TRIPPLETT, DOB August 10, 1960, MRN [id number] PCP: Dani Gobble, PA-C  [facility] Providers Cardiologist:  Yates Decamp, MD {  History of Present Illness: .   Anita Hughes is a 64 y.o. female with a history of PSVT (diagnosed in 2018, obesity with mild OSA not on CPAP, prediabetes, hypertension, hyperlipidemia, history of GERD, hiatal hernia who was last seen 10/2022 and is here for follow-up appointment.  History includes chest pain with exertion under the left breast very suggestive of angina pectoris and also chest pain was brought on by stress.  Underwent stress testing, echocardiogram, and coronary CTA with calcium scoring.  Additional medications she has not had any chest pain and has resumed normal activities.  She was started on metoprolol succinate 50 mg along with Vytorin 10/20 mg in the evening.  Except for chronic dyspnea she has no specific complaints at that time.  Today, she presents with a history of SVT, hypertension, and depression with episodes of dizziness and lightheadedness. The frequency and triggers of these episodes are unclear, but the patient associates them with job-related stress. She denies any recent chest pain, attributing previous episodes to indigestion. The patient has been managing indigestion with over-the-counter remedies like Sprite and ginger ale. The patient's hypertension is currently being managed with metoprolol, taken in the evening. However, the patient has discontinued the morning dose of olmesartan due to excessive sleepiness, which has been affecting her ability to function at work. The patient also takes pantoprazole for reflux. The patient's depression is managed with antidepressants, but the patient reports occasional crying spells.  The patient's job-related stress is significant, with the patient expressing concern about job security and the need for time off for  medical appointments.She presented today with FMLA paperwork for [place] to fill out.  Reports no shortness of breath nor dyspnea on exertion. Reports no chest pain, pressure, or tightness. No edema, orthopnea, PND. Reports no palpitations.   Discussed the use of AI scribe software for clinical note transcription with the patient, who gave verbal consent to proceed.  ROS: Pertinent ROS in HPI  Studies Reviewed: Marland Kitchen        Stress echo from Feb 05, 2020  LV EF: 55-60%. Grade 1 diastolic dysfunction  Stress: Normal functional capacity - 7.2 mets.  Stress ECG: ECG - No ischemic ST segment changes occurred with stress.  No evidence of inducible ischemia at HR achieved. normal LV fxn and no signif valve dz.   PCV MYOCARDIAL PERFUSION WO LEXISCAN 10/15/2022   Narrative Low risk study exercise Sestamibi stress test 10/15/2022: Exercise nuclear stress test was performed using Bruce protocol. 1 Day Rest and Stress images. Hypertensive response to exercise: Yes (rest 170/90, peak 220/90) Exercise time 3 minutes 06 seconds on Bruce protocol, achieved 4.74METS, 88% of age-predicted maximum heart rate (APMHR). Stress ECG negative for ischemia. Normal myocardial perfusion without convincing evidence of reversible myocardial ischemia or prior infarct. Left ventricular cavity size is normal, LVEF 59%, wall thickness and wall motion preserved. Low risk study. No prior studies available for comparison.   PCV ECHOCARDIOGRAM COMPLETE 10/15/2022   Narrative Echocardiogram 10/16/2022: Left ventricle cavity is normal in size and wall thickness. Normal global wall motion. Normal LV systolic function with EF 61%. Doppler evidence of grade I (impaired) diastolic dysfunction, normal LAP. Trileaflet aortic valve with mild aortic valve leaflet calcification, aortic sclerosis. Mild (Grade I) mitral regurgitation. Mild tricuspid regurgitation. No evidence  of pulmonary hypertension.      Physical Exam:   VS:  BP  (!) 164/82   Pulse (!) 59   Ht 5\' 2"  (1.575 m)   Wt 236 lb (107 kg)   LMP 03/21/2000   SpO2 95%   BMI 43.16 kg/m    Wt Readings from Last 3 Encounters:  01/16/24 236 lb (107 kg)  11/09/22 233 lb (105.7 kg)  10/25/22 240 lb (108.9 kg)    GEN: Well nourished, well developed in no acute distress NECK: No JVD; No carotid bruits CARDIAC: RRR, no murmurs, rubs, gallops RESPIRATORY:  Clear to auscultation without rales, wheezing or rhonchi  ABDOMEN: Soft, non-tender, non-distended EXTREMITIES:  No edema; No deformity   ASSESSMENT AND PLAN: .   Hypertension Elevated blood pressure readings. LDL improved to 64 with medication. Patient reported discontinuing Olmesartan due to fatigue. -Resume Olmesartan 10mg  at night. -Track blood pressure for 2 weeks after evening medications and any morning medications.  Supraventricular Tachycardia (SVT) Patient reported occasional episodes of dizziness. -Order 14-day heart monitor to assess frequency and severity of SVT episodes. -Return for follow-up appointment in 3 months to review monitor results and adjust treatment plan as necessary.  Gastroesophageal Reflux Disease (GERD) Patient reported indigestion and use of over-the-counter remedies. -Continue current management strategy.  Depression Patient reported diagnosis and occasional crying spells. -Continue current management with primary care provider.  General Health Maintenance / Followup Plans -Confirm additional evening medication with pharmacy and update medication list as necessary. -Return for follow-up appointment in 3 months to review heart monitor results and adjust treatment plan as necessary.    Dispo: She can return in 3 months for follow-up  Signed, Sharlene Dory, PA-C

## 2024-01-17 ENCOUNTER — Ambulatory Visit: Payer: BC Managed Care – PPO | Attending: Physician Assistant

## 2024-01-17 DIAGNOSIS — I471 Supraventricular tachycardia, unspecified: Secondary | ICD-10-CM

## 2024-01-17 NOTE — Progress Notes (Unsigned)
Enrolled patient for a 14 day Zio XT monitor to be mailed to patients home   Ganji to read

## 2024-01-28 ENCOUNTER — Telehealth: Payer: Self-pay | Admitting: Cardiology

## 2024-01-28 NOTE — Telephone Encounter (Signed)
Patient called to follow-up on the status of her FMLA paperwork submitted on 1/16.  Patient stated can also leave her a voice message on phone.

## 2024-01-29 NOTE — Telephone Encounter (Signed)
Completed FMLA form for [employer] scanned to chart. Left message for patient to come pick up the form. Billing notified.

## 2024-02-06 ENCOUNTER — Encounter: Payer: Self-pay | Admitting: Physician Assistant

## 2024-02-13 NOTE — Telephone Encounter (Signed)
Received a message via pt schedule stating: "Do you know if  the frequency and duration got reviewed and adjusted? I was given fmla for  1 hour for appts and 1 hour per episode for one time  per month when I am not feeling well.  I had asked for that to be increased to 8 hrs per episode and  at least 3 days a month to protect my job. Was that adjusted at all?"  Please advise.

## 2024-02-14 NOTE — Telephone Encounter (Signed)
Spoke with patient and she is aware Dr. Jacinto Halim stated he will not be able to write her out for FMLA from cardiac standpoint. Follow up with pcp

## 2024-02-18 IMAGING — US US BREAST BX W LOC DEV 1ST LESION IMG BX SPEC US GUIDE*L*
1 series · 9 of 9 positions shown · non-contrast
Comparison: None Available.
COMPARISON: None Available.

Addendum:
CLINICAL DATA: Patient with indeterminate left breast mass 3
o'clock position.

EXAM:
ULTRASOUND GUIDED LEFT BREAST CORE NEEDLE BIOPSY

[Series 1: us breast bx w loc dev 1st lesion img bx spec us g · 0.06mm/px · 9 of 9 slices shown]
[im 1/9]
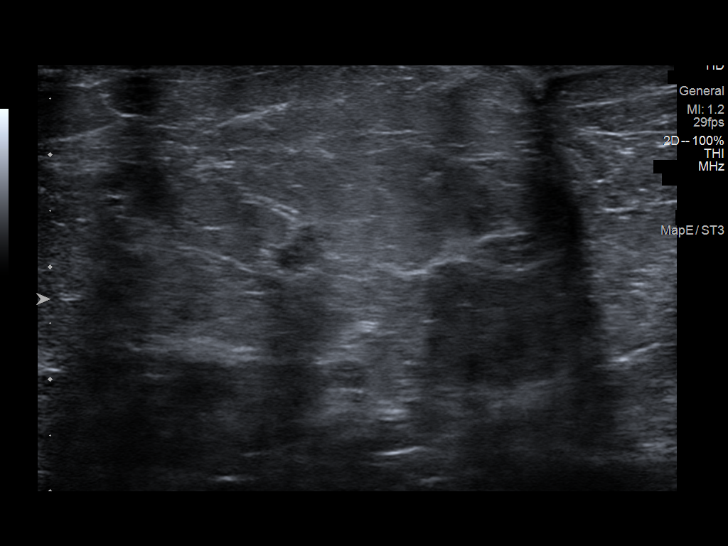
[im 2/9]
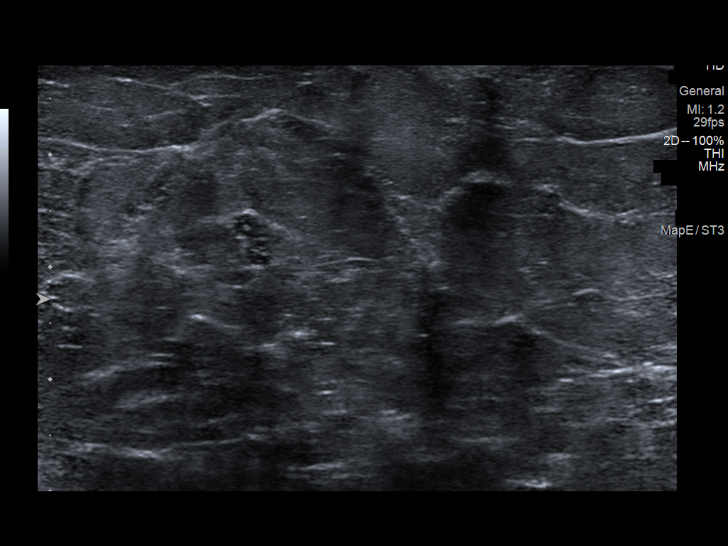
[im 3/9]
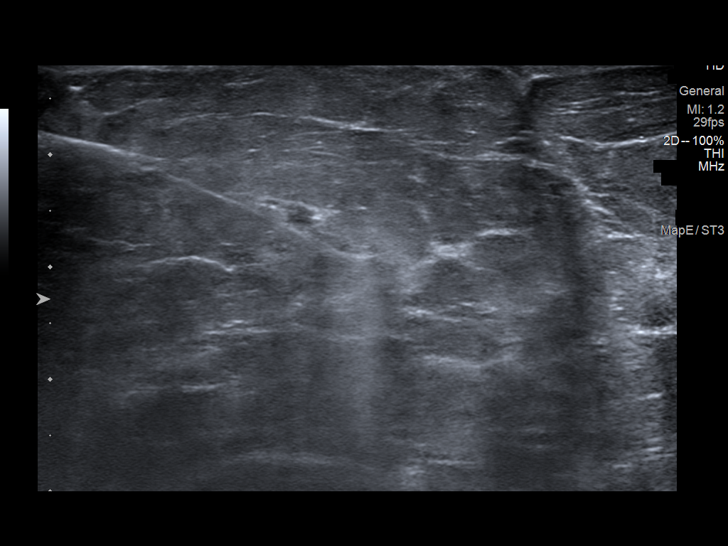
[im 4/9]
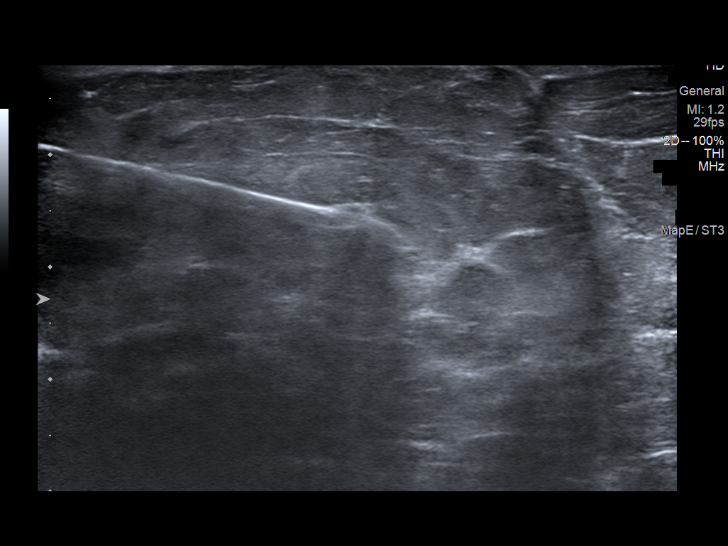
[im 5/9]
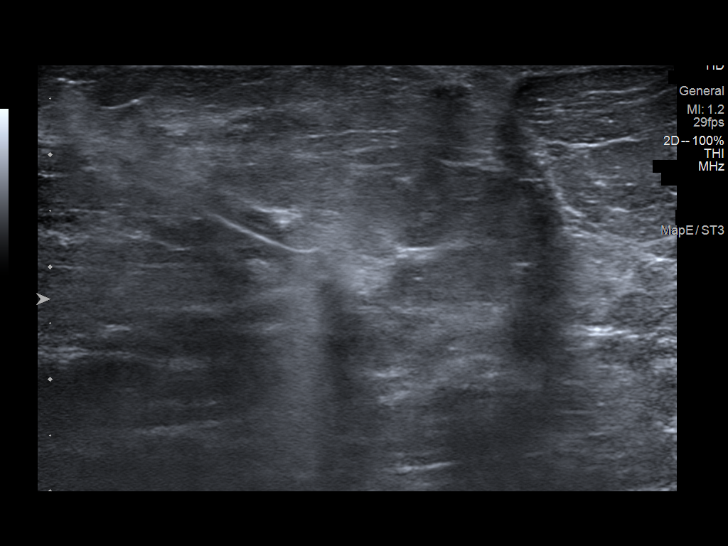
[im 6/9]
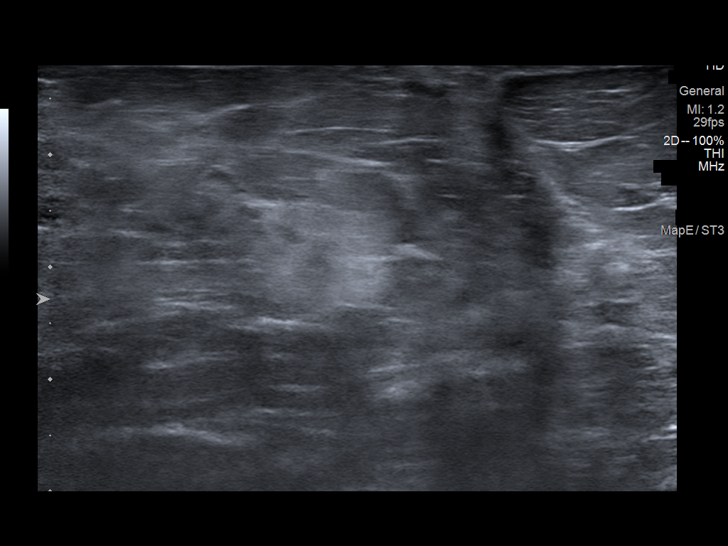
[im 7/9]
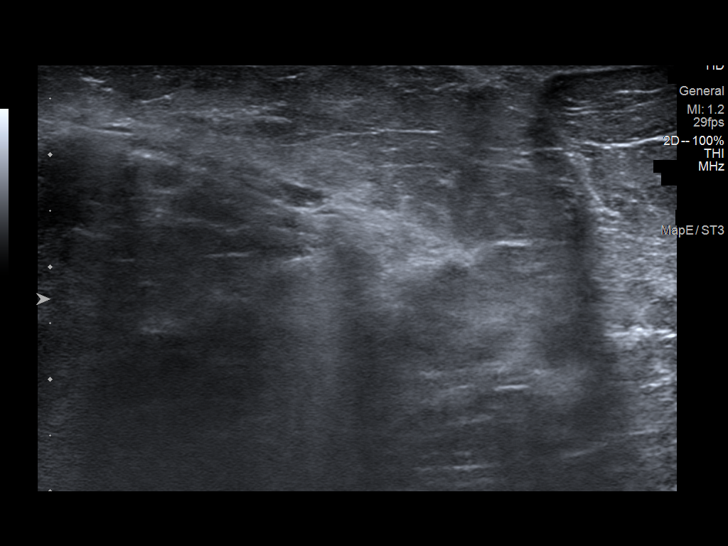
[im 8/9]
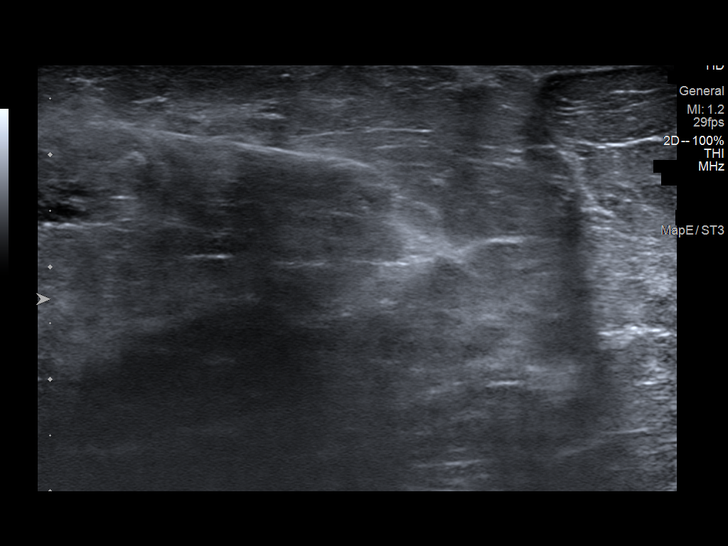
[im 9/9]
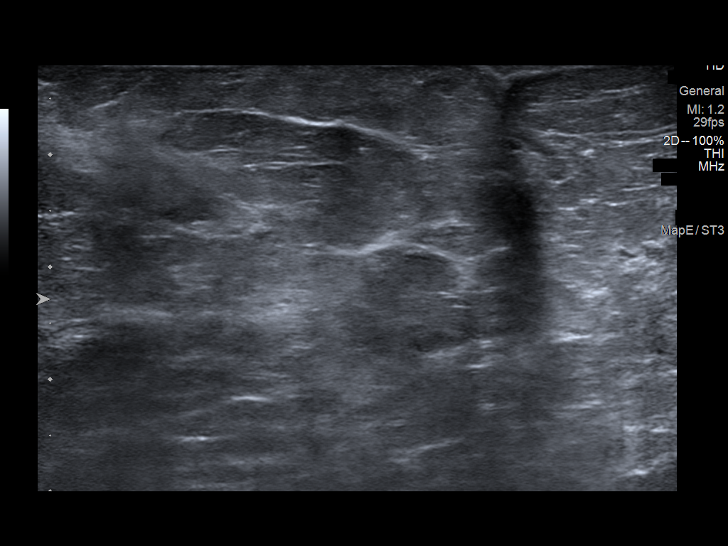

[9 of 9 positions shown; findings below may reference images not displayed]



Lesion quadrant: Upper outer quadrant

Using sterile technique and 1% Lidocaine as local anesthetic, under
direct ultrasound visualization, a 14 gauge Chaires device was
used to perform biopsy of left breast mass 3 o'clock position using
a lateral approach. At the conclusion of the procedure ribbon shaped
tissue marker clip was deployed into the biopsy cavity. Follow up 2
view mammogram was performed and dictated separately.
IMPRESSION: Ultrasound guided biopsy of left breast mass 3 o'clock position. No
apparent complications.

ADDENDUM:
Pathology revealed INTRADUCTAL PAPILLOMA of the LEFT breast, 3:00
o'clock, (ribbon clip). This was found to be concordant by Dr. Bak
Wilder Enrique, with surgical consultation for excision recommended, per
[HOSPITAL] Breast Working Group protocol.

Pathology results were discussed with the patient by telephone. The
patient reported doing well after the biopsy with tenderness at the
site. Post biopsy instructions and care were reviewed, and questions
were answered. The patient was encouraged to call The [REDACTED] for any additional concerns. My direct phone
number was provided.

Surgical consultation has been arranged with Dr. Macho Tiger at
[REDACTED] on June 19, 2022.

Pathology results reported by Qomandan Tiger, RN on 05/30/2022.



Lesion quadrant: Upper outer quadrant

Using sterile technique and 1% Lidocaine as local anesthetic, under
direct ultrasound visualization, a 14 gauge Chaires device was
used to perform biopsy of left breast mass 3 o'clock position using
a lateral approach. At the conclusion of the procedure ribbon shaped
tissue marker clip was deployed into the biopsy cavity. Follow up 2
view mammogram was performed and dictated separately.
IMPRESSION: Ultrasound guided biopsy of left breast mass 3 o'clock position. No
apparent complications.

## 2024-02-18 IMAGING — MG MM BREAST LOCALIZATION CLIP
5 series · 6 of 13 positions shown · non-contrast
Comparison: Previous exam(s).

CLINICAL DATA: Patient status post ultrasound-guided biopsy left
breast mass 3 o'clock position.

EXAM:
3D DIAGNOSTIC LEFT MAMMOGRAM POST ULTRASOUND BIOPSY

[L CC]
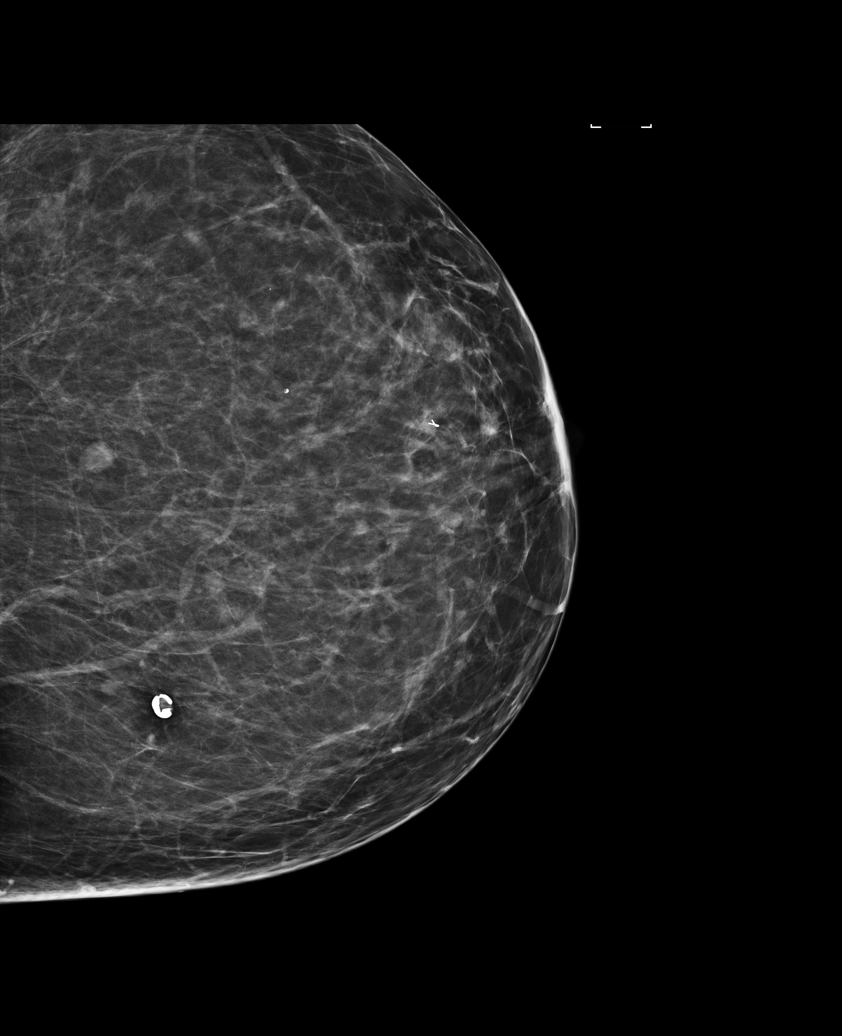

[L ML synth-2D]
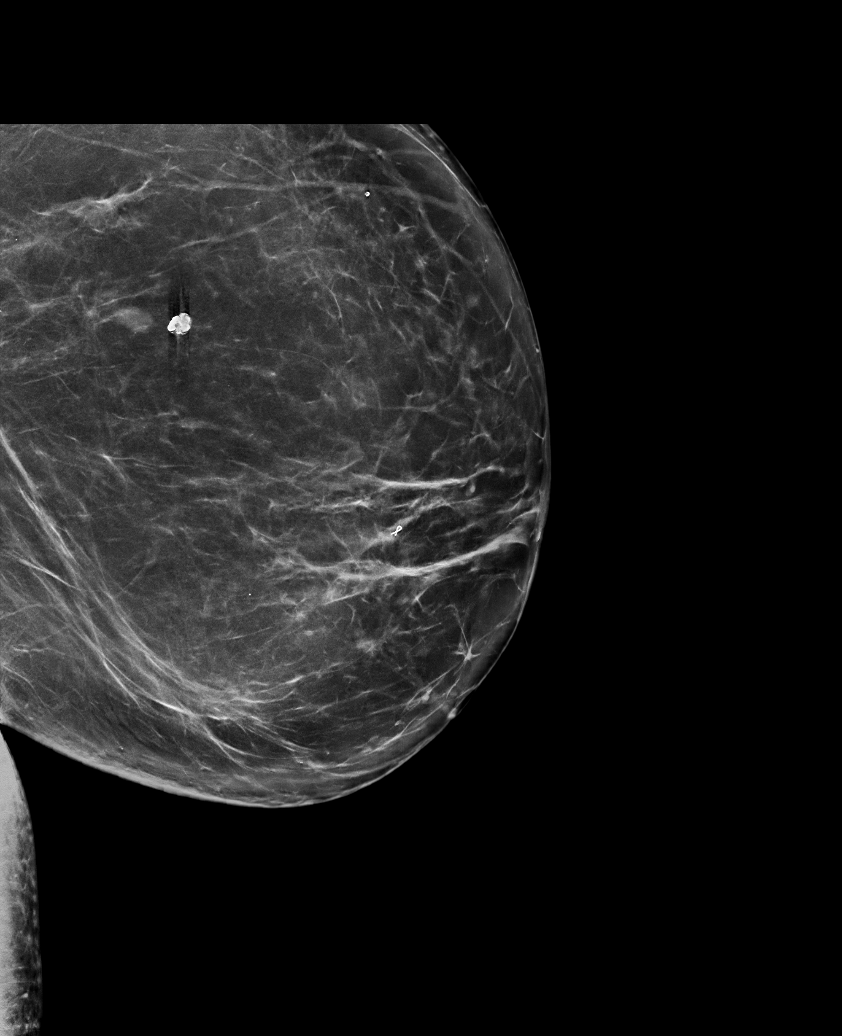

[L CC synth-2D]
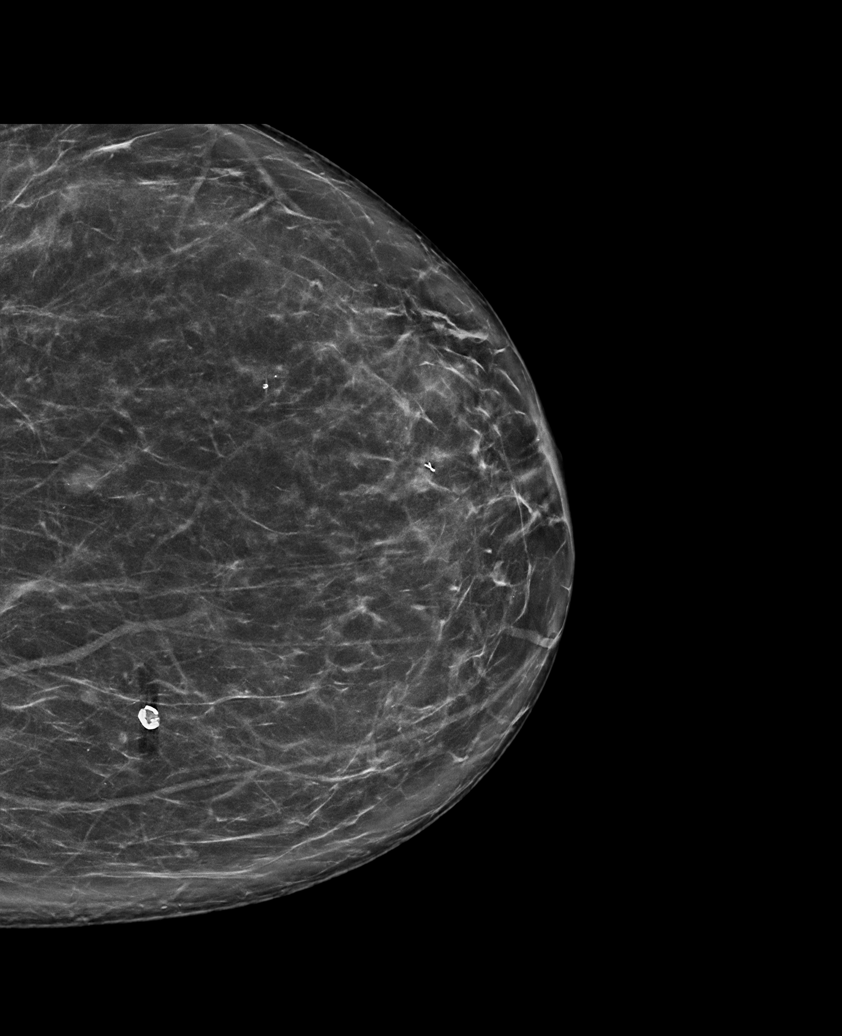

[L CC tomo · 2 of 86 frames shown]
[frame 28/86]
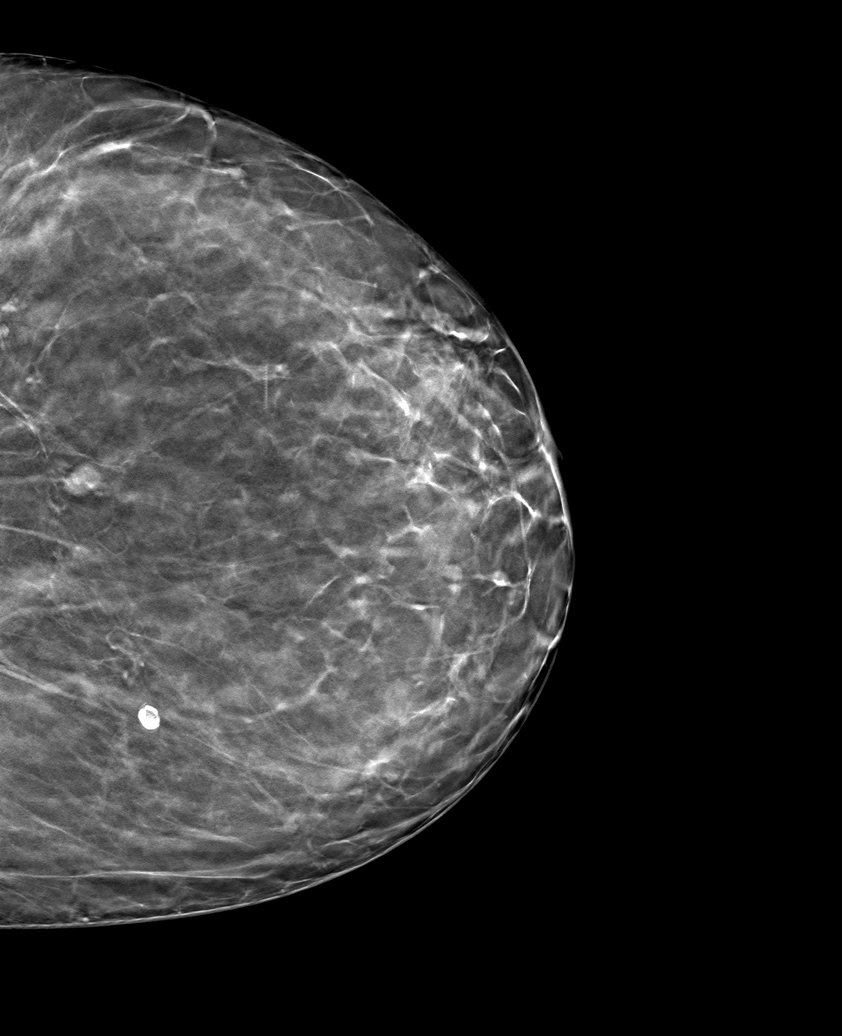
[frame 43/86]
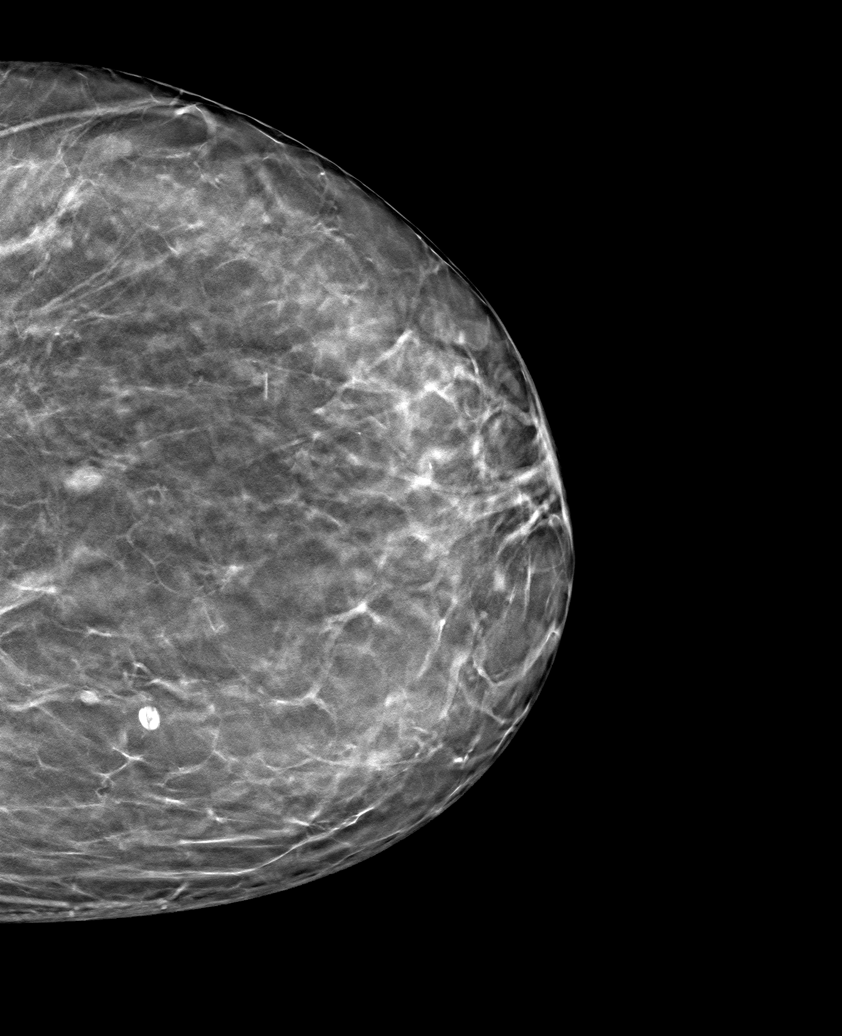

[L ML tomo · tomo slice 49/96.0]
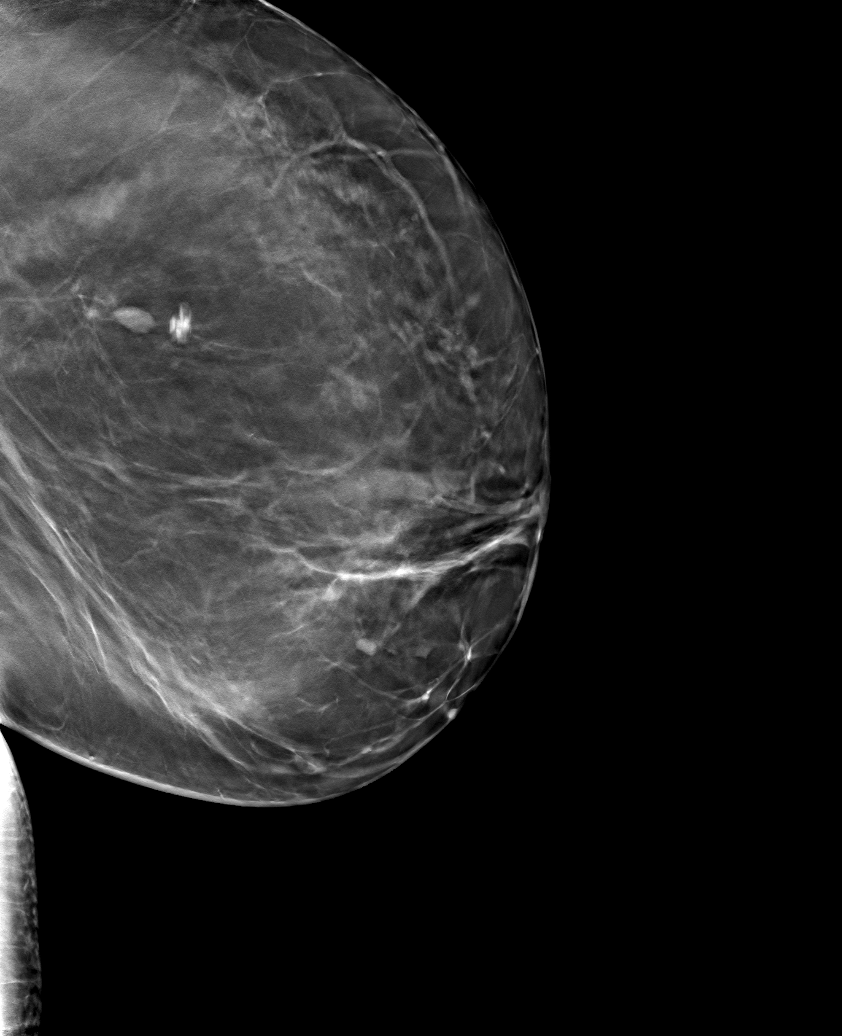

[6 of 13 positions shown; findings below may reference images not displayed]

FINDINGS: 3D Mammographic images were obtained following ultrasound guided
biopsy of left breast mass 3 o'clock position. The biopsy marking
clip is in expected position at the site of biopsy.
IMPRESSION: Appropriate positioning of the ribbon shaped biopsy marking clip at
the site of biopsy in the lateral anterior left breast.

Final Assessment: Post Procedure Mammograms for Marker Placement

## 2024-04-06 ENCOUNTER — Telehealth: Payer: Self-pay | Admitting: Cardiology

## 2024-04-06 NOTE — Telephone Encounter (Signed)
 I spoke with [facility] and told them patient is to take Olmesartan 10 mg daily at bedtime and that it is written for half of a 20 mg tablet.   Pharmacy will make patient aware.

## 2024-04-06 NOTE — Telephone Encounter (Signed)
 Pharmacy called in asking if pt should be on only Olmesartan or Olmesartan-Hydrochlorothiazide. She states the pt is not sure which one she is on. Please advise.

## 2024-04-20 ENCOUNTER — Ambulatory Visit: Payer: BC Managed Care – PPO | Attending: Cardiology | Admitting: Cardiology

## 2024-04-20 ENCOUNTER — Encounter: Payer: Self-pay | Admitting: Cardiology

## 2024-04-20 ENCOUNTER — Encounter: Payer: Self-pay | Admitting: *Deleted

## 2024-04-20 VITALS — BP 166/84 | HR 89 | Ht 61.0 in | Wt 234.4 lb

## 2024-04-20 DIAGNOSIS — Z6841 Body Mass Index (BMI) 40.0 and over, adult: Secondary | ICD-10-CM

## 2024-04-20 DIAGNOSIS — I10 Essential (primary) hypertension: Secondary | ICD-10-CM

## 2024-04-20 DIAGNOSIS — E119 Type 2 diabetes mellitus without complications: Secondary | ICD-10-CM | POA: Diagnosis not present

## 2024-04-20 DIAGNOSIS — E7800 Pure hypercholesterolemia, unspecified: Secondary | ICD-10-CM | POA: Diagnosis not present

## 2024-04-20 DIAGNOSIS — R931 Abnormal findings on diagnostic imaging of heart and coronary circulation: Secondary | ICD-10-CM

## 2024-04-20 DIAGNOSIS — E66813 Obesity, class 3: Secondary | ICD-10-CM

## 2024-04-20 MED ORDER — OLMESARTAN MEDOXOMIL 20 MG PO TABS: 20.0000 mg | ORAL_TABLET | Freq: Every day | ORAL | 0 refills | Status: DC

## 2024-04-20 NOTE — Progress Notes (Signed)
 Cardiology Office Note:  .   Date:  04/20/2024  ID:  Anita Hughes, DOB 11-04-1960, MRN [id number] PCP: Jearlean Mince, PA-C  [facility] Providers Cardiologist:  Knox Perl, MD   History of Present Illness: .    Discussed the use of AI scribe software for clinical note transcription with the patient, who gave verbal consent to proceed.  History of Present Illness The patient, with a history of mild sleep apnea and obesity, presents with elevated blood pressure. She does not regularly monitor her blood pressure at home. She admits to a lack of physical activity but plans to start walking after work due to a recent shift change. She has mild sleep apnea but does not use a CPAP machine. She reports feeling groggy in the morning but denies excessive daytime sleepiness or napping. She typically watches TV before bed and sleeps on her back initially, but wakes up on her side. She works at the [employer] and is often on her feet. She has a history of diabetes with a hemoglobin A1c of 6.0% and is not currently on any medication for it. She also has a history of elevated coronary calcium  score in the 92nd percentile, indicating plaque buildup in the heart.  Anita Hughes is a 64 y.o. African-American female patient with history of PSVT first diagnosed in 2018, morbid obesity with mild OSA not on CPAP, prediabetes mellitus, hypertension, hyperlipidemia, history of GERD, hiatal hernia, coronary calcium  score in 2023 in the 92nd percentile, presents for 64-month visit, was seen for palpitations previously.  Previously she was having symptoms suggestive of angina pectoris, presently asymptomatic.  She is tolerating Vytorin  that was started for her cholesterol and lipids are at goal.  She has no specific complaints today.  She continues to battle her weight.  Labs   Lab Results  Component Value Date   CHOL 254 (H) 09/14/2022   HDL 51 09/14/2022   LDLCALC 185 (H) 09/14/2022   TRIG  102 09/14/2022   CHOLHDL 4.4 05/09/2018   Lab Results  Component Value Date   NA 139 10/25/2022   K 4.0 10/25/2022   CO2 23 10/25/2022   GLUCOSE 131 (H) 10/25/2022   BUN 12 10/25/2022   CREATININE 0.96 10/25/2022   CALCIUM  9.3 10/25/2022   GFRNONAA >60 10/25/2022      Latest Ref Rng & Units 10/25/2022    5:35 AM 12/11/2020    9:11 AM 05/09/2018    7:19 AM  BMP  Glucose 70 - 99 mg/dL 409  811  914   BUN 8 - 23 mg/dL 12  11  13    Creatinine 0.44 - 1.00 mg/dL 7.82  9.56  2.13   BUN/Creat Ratio 9 - 23   17   Sodium 135 - 145 mmol/L 139  138  140   Potassium 3.5 - 5.1 mmol/L 4.0  4.0  3.8   Chloride 98 - 111 mmol/L 109  106  101   CO2 22 - 32 mmol/L 23  19  24    Calcium  8.9 - 10.3 mg/dL 9.3  9.1  9.5       Latest Ref Rng & Units 10/25/2022    5:35 AM 09/14/2022    9:49 AM 12/11/2020    9:11 AM  CBC  WBC 4.0 - 10.5 K/uL 7.3  5.8  7.9   Hemoglobin 12.0 - 15.0 g/dL 08.6  57.8  46.9   Hematocrit 36.0 - 46.0 % 42.7  41.2  46.1  Platelets 150 - 400 K/uL 320  334  331    Lab Results  Component Value Date   HGBA1C 6.2 (H) 09/14/2022    Lab Results  Component Value Date   TSH 1.680 09/14/2022    External Labs:  Care Everywhere PCP labs 03/09/2024:  BUN 10, creatinine 0.77, EGFR 87 mL, potassium 4.3.  LFTs normal.  A1c 6.5%.  TSH normal at 1.410.  Vitamin D30.9.  Labs 11/25/2023:  Total cholesterol 128, triglycerides 72, HDL 49, LDL 64.  Review of Systems  Cardiovascular:  Negative for chest pain, dyspnea on exertion and leg swelling.   Physical Exam:   VS:  BP (!) 166/84   Pulse 89   Ht 5\' 1"  (1.549 m)   Wt 234 lb 6.4 oz (106.3 kg)   LMP 03/21/2000   SpO2 92%   BMI 44.29 kg/m    Wt Readings from Last 3 Encounters:  04/20/24 234 lb 6.4 oz (106.3 kg)  01/16/24 236 lb (107 kg)  11/09/22 233 lb (105.7 kg)    Physical Exam Constitutional:      Appearance: She is morbidly obese.  Neck:     Vascular: No carotid bruit or JVD.  Cardiovascular:     Rate and  Rhythm: Normal rate and regular rhythm.     Pulses: Intact distal pulses.     Heart sounds: Normal heart sounds. No murmur heard.    No gallop.  Pulmonary:     Effort: Pulmonary effort is normal.     Breath sounds: Normal breath sounds.  Abdominal:     General: Bowel sounds are normal.     Palpations: Abdomen is soft.  Musculoskeletal:     Right lower leg: No edema.     Left lower leg: No edema.    Studies Reviewed: .    NA EKG:         Medications and allergies    Allergies  Allergen Reactions   Pravastatin Other (See Comments)    Shortness of breath   Rosuvastatin Other (See Comments)    Patient wakes up with short breaths/hard to catch breaths when taking this med.      Current Outpatient Medications:    aspirin  (ASPIRIN  CHILDRENS) 81 MG chewable tablet, Chew 1 tablet (81 mg total) by mouth daily., Disp: , Rfl:    Cholecalciferol (DIALYVITE  VITAMIN D  5000) 125 MCG (5000 UT) capsule, Take 1 capsule (5,000 Units total) by mouth daily., Disp: 90 capsule, Rfl: 0   ezetimibe -simvastatin  (VYTORIN ) 10-40 MG tablet, TAKE 1 TABLET BY MOUTH EVERY DAY AT 6 PM, Disp: 90 tablet, Rfl: 3   metoprolol  succinate (TOPROL -XL) 100 MG 24 hr tablet, TAKE 1 TABLET BY MOUTH EVERY EVENING TAKE WITH OR immediately following A meal, Disp: 90 tablet, Rfl: 3   pantoprazole  (PROTONIX ) 40 MG tablet, Take 40 mg by mouth daily., Disp: , Rfl:    olmesartan  (BENICAR ) 20 MG tablet, Take 1 tablet (20 mg total) by mouth at bedtime., Disp: 90 tablet, Rfl: 0   Meds ordered this encounter  Medications   olmesartan  (BENICAR ) 20 MG tablet    Sig: Take 1 tablet (20 mg total) by mouth at bedtime.    Dispense:  90 tablet    Refill:  0    Sent refills to Jearlean Mince, PA-C    ASSESSMENT AND PLAN: .      ICD-10-CM   1. Elevated coronary artery calcium  score Total score 120, MESA database percentile 92 11/05/22  R93.1  2. Primary hypertension  I10 olmesartan  (BENICAR ) 20 MG tablet    3. Pure  hypercholesterolemia  E78.00     4. Type 2 diabetes mellitus without complication, without long-term current use of insulin (HCC)  E11.9     5. Class 3 severe obesity due to excess calories with serious comorbidity and body mass index (BMI) of 40.0 to 44.9 in adult [facility])  Z61.096    E66.01    Z68.41       Assessment and Plan Assessment & Plan Hypertension   Blood pressure is elevated, likely due to stopping omisartan. No recent chest pain. Blood pressure management is crucial to prevent cardiovascular events. Restart omisartan, taking a full pill at night. Discontinue nitroglycerin . Continue aspirin  therapy.  Elevated Coronary Calcium  Score   Coronary calcium  score is in the 92nd percentile, indicating significant plaque buildup and increased cardiovascular risk. Weight loss and lifestyle changes are essential to mitigate risk. Without intervention, there is a risk of cardiovascular events by age 91-75. Initiate Ozempic or Mounjaro for weight loss. Encourage regular physical activity and dietary modifications.  Obesity   Obesity contributes to increased cardiovascular risk and prediabetes. Weight loss is essential for improving overall health and reducing risk factors. Start Ozempic or Mounjaro for weight loss. Advise on slow eating to enhance medication efficacy and reduce nausea. Encourage regular physical activity.  Diabetes   Hemoglobin A1c is 6.5%, indicating diabetes. Weight loss and lifestyle changes are crucial to prevent progression to diabetes. Start Ozempic or Mounjaro to aid in weight loss and improve glycemic control.  Mild Sleep Apnea   Mild sleep apnea diagnosed years ago, not using CPAP. No significant daytime sleepiness. Sleep hygiene improvements may benefit overall health. Advise against watching TV or using electronic devices before bed to improve sleep quality.  Vitamin D  Deficiency   Previous vitamin D  level was 18, now improved to 30. Continued supplementation is  necessary to maintain adequate levels. Resume vitamin D  supplementation with 5000 units over the counter.   5. Class 3 severe obesity due to excess calories with serious comorbidity and body mass index (BMI) of 40.0 to 44.9 in adult [facility]) Patient has significant cardiovascular risk factors, however she does not need any further cardiac workup, she remains asymptomatic without angina or clinical evidence of heart failure, EKG previously has been normal and also physical examination is completely normal except for morbid obesity.  In view of new onset diabetes mellitus, morbid obesity, she would be an excellent candidate for GLP-1 agonist.  She needs a follow-up visit with her PCP to manage her hypertension and also discuss weight loss and diabetes.  Signed,  Knox Perl, MD, [facility] 04/20/2024, 2:22 PM [facility] [address] [place] [address] [place], [place] [address] Phone: [phone]. Fax:  [phone]

## 2024-04-20 NOTE — Patient Instructions (Signed)
 Medication Instructions:  Your physician recommends that you continue on your current medications as directed. Please refer to the Current Medication list given to you today.  *If you need a refill on your cardiac medications before your next appointment, please call your pharmacy*  Lab Work: none If you have labs (blood work) drawn today and your tests are completely normal, you will receive your results only by: MyChart Message (if you have MyChart) OR A paper copy in the mail If you have any lab test that is abnormal or we need to change your treatment, we will call you to review the results.  Testing/Procedures: none  Follow-Up: At [facility], you and your health needs are our priority.  As part of our continuing mission to provide you with exceptional heart care, our providers are all part of one team.  This team includes your primary Cardiologist (physician) and Advanced Practice Providers or APPs (Physician Assistants and Nurse Practitioners) who all work together to provide you with the care you need, when you need it.  Your next appointment:   As needed  Provider:   Yates Decamp, MD     We recommend signing up for the patient portal called "MyChart".  Sign up information is provided on this After Visit Summary.  MyChart is used to connect with patients for Virtual Visits (Telemedicine).  Patients are able to view lab/test results, encounter notes, upcoming appointments, etc.  Non-urgent messages can be sent to your provider as well.   To learn more about what you can do with MyChart, go to [email or web address].   Other Instructions       1st Floor: - Lobby - Registration  - Pharmacy  - Lab - Cafe  2nd Floor: - PV Lab - Diagnostic Testing (echo, CT, nuclear med)  3rd Floor: - Vacant  4th Floor: - TCTS (cardiothoracic surgery) - AFib Clinic - Structural Heart Clinic - Vascular Surgery  - Vascular Ultrasound  5th Floor: - [facility]Care Cardiology  (general and EP) - Clinical Pharmacy for coumadin, hypertension, lipid, weight-loss medications, and med management appointments    Valet parking services will be available as well.

## 2024-04-24 NOTE — Telephone Encounter (Signed)
 Patient's FMLA paperwork will be discarded since patient hasn't picked it up from our office.

## 2024-06-01 ENCOUNTER — Ambulatory Visit (HOSPITAL_COMMUNITY)
Admission: EM | Admit: 2024-06-01 | Discharge: 2024-06-01 | Disposition: A | Discharge status: Home / Self Care | Attending: Family Medicine | Admitting: Family Medicine

## 2024-06-01 ENCOUNTER — Encounter (HOSPITAL_COMMUNITY): Payer: Self-pay

## 2024-06-01 DIAGNOSIS — R0982 Postnasal drip: Secondary | ICD-10-CM | POA: Diagnosis not present

## 2024-06-01 MED ORDER — FLUTICASONE PROPIONATE 50 MCG/ACT NA SUSP: 2.0000 | Freq: Every day | NASAL | 2 refills | Status: DC

## 2024-06-01 NOTE — Discharge Instructions (Signed)
 Please go to the pharmacy and pick up fluticasone  propionate, I have sent it as a prescription but it is also over-the-counter so you can pick whichever is cheaper as they both work the same.  Please continue using your humidifier.  I would also recommend you start Claritin 10 mg nightly.

## 2024-06-01 NOTE — ED Provider Notes (Signed)
 [facility]    CSN: [id number] Arrival date & time: 06/01/24  0803      History   Chief Complaint Chief Complaint  Patient presents with   Cough    HPI Anita Hughes is a 64 y.o. female.   Pt presenting with cough for 2 weeks. Patient notes some congestion nasally. Cough worst at night. No fevers or chills. No SOB, no hx of asthma or COPD. No other concerns at this time.    Cough   Past Medical History:  Diagnosis Date   Colon polyps    GERD (gastroesophageal reflux disease)    Hemorrhoids    Hiatal hernia    Hyperlipidemia    Hypertension    Obesity    SVT (supraventricular tachycardia) (HCC)     Patient Active Problem List   Diagnosis Date Noted   Elevated coronary artery calcium  score Total score 120, MESA database percentile 92 11/05/22 11/09/2022   Pure hypercholesterolemia 07/09/2022   Viral URI with cough 02/07/2018   Supraventricular tachycardia (HCC) 11/28/2017   Pre-diabetes 11/15/2015   Health care maintenance 11/15/2015   Primary hypertension    Obesity 03/09/2008   ESOPHAGEAL STRICTURE 03/09/2008   Hiatal hernia with gastroesophageal reflux 03/09/2008    Past Surgical History:  Procedure Laterality Date   ABDOMINAL HYSTERECTOMY  01/01/2000   FIBROIDS   BREAST BIOPSY Left 05/24/2022   BREAST LUMPECTOMY WITH RADIOACTIVE SEED LOCALIZATION Left 07/26/2022   Procedure: LEFT BREAST LUMPECTOMY WITH RADIOACTIVE SEED LOCALIZATION;  Surgeon: Sim Dryer, MD;  Location: [facility];  Service: General;  Laterality: Left;   CESAREAN SECTION  12/31/1988    OB History     Gravida  4   Para  4   Term  4   Preterm      AB      Living  4      SAB      IAB      Ectopic      Multiple      Live Births  4            Home Medications    Prior to Admission medications   Medication Sig Start Date End Date Taking? Authorizing Provider  fluticasone  (FLONASE ) 50 MCG/ACT nasal spray Place 2 sprays into  both nostrils daily. 06/01/24  Yes Balraj Brayfield, MD  aspirin  (ASPIRIN  CHILDRENS) 81 MG chewable tablet Chew 1 tablet (81 mg total) by mouth daily. 09/14/22   Knox Perl, MD  Cholecalciferol (DIALYVITE  VITAMIN D  5000) 125 MCG (5000 UT) capsule Take 1 capsule (5,000 Units total) by mouth daily. 11/09/22   Knox Perl, MD  ezetimibe -simvastatin  (VYTORIN ) 10-40 MG tablet TAKE 1 TABLET BY MOUTH EVERY DAY AT 6 PM 01/16/24   Conte, Tessa N, PA-C  metoprolol  succinate (TOPROL -XL) 100 MG 24 hr tablet TAKE 1 TABLET BY MOUTH EVERY EVENING TAKE WITH OR immediately following A meal 01/16/24   Conte, Tessa N, PA-C  olmesartan  (BENICAR ) 20 MG tablet Take 1 tablet (20 mg total) by mouth at bedtime. 04/20/24   Knox Perl, MD  pantoprazole  (PROTONIX ) 40 MG tablet Take 40 mg by mouth daily. 06/16/19   [provider]    Family History Family History  Problem Relation Age of Onset   Hypertension Mother    Heart attack Father 39       2 heart attacks   Hypertension Sister    Hypertension Sister    Hypertension Sister    Hypertension Brother  Social History Social History   Tobacco Use   Smoking status: Never   Smokeless tobacco: Never  Vaping Use   Vaping status: Never Used  Substance Use Topics   Alcohol use: No    Alcohol/week: 0.0 standard drinks of alcohol   Drug use: No     Allergies   Pravastatin and Rosuvastatin   Review of Systems Review of Systems  Respiratory:  Positive for cough.      Physical Exam Triage Vital Signs ED Triage Vitals [06/01/24 0830]  Encounter Vitals Group     BP (!) 179/82     Systolic BP Percentile      Diastolic BP Percentile      Pulse Rate 68     Resp 18     Temp 97.9 F (36.6 C)     Temp Source Oral     SpO2 98 %     Weight      Height      Head Circumference      Peak Flow      Pain Score 0     Pain Loc      Pain Education      Exclude from Growth Chart    No data found.  Updated Vital Signs BP (!) 179/82 (BP Location: Left Arm)    Pulse 68   Temp 97.9 F (36.6 C) (Oral)   Resp 18   LMP 03/21/2000   SpO2 98%   Visual Acuity Right Eye Distance:   Left Eye Distance:   Bilateral Distance:    Right Eye Near:   Left Eye Near:    Bilateral Near:     Physical Exam Constitutional:      Appearance: Normal appearance.  HENT:     Nose: Congestion and rhinorrhea present.     Mouth/Throat:     Pharynx: Posterior oropharyngeal erythema present. No oropharyngeal exudate.  Cardiovascular:     Rate and Rhythm: Normal rate and regular rhythm.  Pulmonary:     Effort: Pulmonary effort is normal. No respiratory distress.     Breath sounds: Normal breath sounds. No stridor. No wheezing or rhonchi.  Neurological:     Mental Status: She is alert.      UC Treatments / Results  Labs (all labs ordered are listed, but only abnormal results are displayed) Labs Reviewed - No data to display  EKG   Radiology No results found.  Procedures Procedures (including critical care time)  Medications Ordered in UC Medications - No data to display  Initial Impression / Assessment and Plan / UC Course  I have reviewed the triage vital signs and the nursing notes.  Pertinent labs & imaging results that were available during my care of the patient were reviewed by me and considered in my medical decision making (see chart for details).     Patient likely dealing with postnasal drip at this time, we will go ahead and recommend Flonase  as well as continue humidified air r.  Patient also noted to have some inflamed nasal turbinates likely related to allergic rhinitis.  Will recommend Claritin nightly as well. Final Clinical Impressions(s) / UC Diagnoses   Final diagnoses:  Post-nasal drip     Discharge Instructions      Please go to the pharmacy and pick up fluticasone  propionate, I have sent it as a prescription but it is also over-the-counter so you can pick whichever is cheaper as they both work the same.  Please  continue using your humidifier.  I would also recommend you start Claritin 10 mg nightly.   ED Prescriptions     Medication Sig Dispense Auth. Provider   fluticasone  (FLONASE ) 50 MCG/ACT nasal spray Place 2 sprays into both nostrils daily. 18.2 mL Brock Mokry, MD      PDMP not reviewed this encounter.   Jude Norton, MD 06/01/24 [phone]

## 2024-06-01 NOTE — ED Triage Notes (Signed)
 Pt c/o cough, congestion, and wheezing x2wks. States on her 2nd box of mucinex .

## 2024-09-05 ENCOUNTER — Encounter (HOSPITAL_COMMUNITY): Payer: Self-pay

## 2024-09-05 ENCOUNTER — Ambulatory Visit (HOSPITAL_COMMUNITY)
Admission: EM | Admit: 2024-09-05 | Discharge: 2024-09-05 | Disposition: A | Discharge status: Home / Self Care | Payer: Self-pay | Attending: Physician Assistant | Admitting: Physician Assistant

## 2024-09-05 DIAGNOSIS — R1030 Lower abdominal pain, unspecified: Secondary | ICD-10-CM | POA: Insufficient documentation

## 2024-09-05 DIAGNOSIS — N3001 Acute cystitis with hematuria: Secondary | ICD-10-CM | POA: Insufficient documentation

## 2024-09-05 LAB — POCT URINALYSIS DIP (MANUAL ENTRY)
Bilirubin, UA: NEGATIVE
Glucose, UA: NEGATIVE mg/dL
Ketones, POC UA: NEGATIVE mg/dL
Nitrite, UA: NEGATIVE
Protein Ur, POC: NEGATIVE mg/dL
Spec Grav, UA: 1.015 (ref 1.010–1.025)
Urobilinogen, UA: 0.2 U/dL
pH, UA: 6 (ref 5.0–8.0)

## 2024-09-05 MED ORDER — SULFAMETHOXAZOLE-TRIMETHOPRIM 800-160 MG PO TABS: 1.0000 | ORAL_TABLET | Freq: Two times a day (BID) | ORAL | 0 refills | Status: AC

## 2024-09-05 NOTE — ED Triage Notes (Signed)
 Patient reports that she has lower abdominal pain, dysuria, and urinary frequency x 3 days.  Patient states she thought she had bowel problems and was taking Pepto bismol, ginger ale and crackers.

## 2024-09-05 NOTE — Discharge Instructions (Addendum)
 We are treating you for urinary tract infection.  Please start Bactrim  DS twice daily for 5 days.  If you develop any rash or lesions stop the medication and be seen immediately.  Make sure you rest and drink plenty of fluid.  I will contact you if need to stop or change your antibiotics based on your culture results. There was some blood noted in your urinalysis and so I recommend that you follow-up with your primary care in 2 to 4 weeks to have this repeated and ensure that it goes away once we have treated the infection. If anything worsens and you have abdominal pain, blood in your urine, nausea, vomiting, fever, weakness, persistent or worsening symptoms you should be seen immediately.  As we discussed, if your abdominal pain worsens or if you are not feeling better within a day please go to the ER.

## 2024-09-05 NOTE — ED Provider Notes (Signed)
 [facility]    CSN: [id number] Arrival date & time: 09/05/24  1009      History   Chief Complaint Chief Complaint  Patient presents with   Abdominal Pain   Dysuria   Urinary Frequency    HPI Anita Hughes is a 64 y.o. female.   Patient presents today with a 3-day history of lower abdominal discomfort.  She reports pain with micturition, dysuria, frequency, urgency.  She denies any vaginal symptoms including pelvic pain, vaginal discharge, abnormal bleeding.  Denies any fever, nausea, vomiting.  She initially thought she might have an upset stomach and so did take a dose of Pepto-Bismol but this did not provide any relief of symptoms.  She reports that she is having normal bowel movements.  She has had urinary tract infections in the past with similar presentation.  She denies history of nephrolithiasis, single kidney, recent urogenital procedure, catheterization.  She denies any recent antibiotic use.  She does not have a history of diabetes and does not take SGLT2 inhibitor.      Past Medical History:  Diagnosis Date   Colon polyps    GERD (gastroesophageal reflux disease)    Hemorrhoids    Hiatal hernia    Hyperlipidemia    Hypertension    Obesity    SVT (supraventricular tachycardia) (HCC)     Patient Active Problem List   Diagnosis Date Noted   Elevated coronary artery calcium  score Total score 120, MESA database percentile 92 11/05/22 11/09/2022   Pure hypercholesterolemia 07/09/2022   Viral URI with cough 02/07/2018   Supraventricular tachycardia (HCC) 11/28/2017   Pre-diabetes 11/15/2015   Health care maintenance 11/15/2015   Primary hypertension    Obesity 03/09/2008   ESOPHAGEAL STRICTURE 03/09/2008   Hiatal hernia with gastroesophageal reflux 03/09/2008    Past Surgical History:  Procedure Laterality Date   ABDOMINAL HYSTERECTOMY  01/01/2000   FIBROIDS   BREAST BIOPSY Left 05/24/2022   BREAST LUMPECTOMY WITH RADIOACTIVE SEED  LOCALIZATION Left 07/26/2022   Procedure: LEFT BREAST LUMPECTOMY WITH RADIOACTIVE SEED LOCALIZATION;  Surgeon: Vanderbilt Ned, MD;  Location: [facility];  Service: General;  Laterality: Left;   CESAREAN SECTION  12/31/1988    OB History     Gravida  4   Para  4   Term  4   Preterm      AB      Living  4      SAB      IAB      Ectopic      Multiple      Live Births  4            Home Medications    Prior to Admission medications   Medication Sig Start Date End Date Taking? Authorizing Provider  sulfamethoxazole -trimethoprim  (BACTRIM  DS) 800-160 MG tablet Take 1 tablet by mouth 2 (two) times daily for 5 days. 09/05/24 09/10/24 Yes Kristie Bracewell K, PA-C  aspirin  (ASPIRIN  CHILDRENS) 81 MG chewable tablet Chew 1 tablet (81 mg total) by mouth daily. 09/14/22   Ladona Heinz, MD  Cholecalciferol (DIALYVITE  VITAMIN D  5000) 125 MCG (5000 UT) capsule Take 1 capsule (5,000 Units total) by mouth daily. Patient not taking: Reported on 09/05/2024 11/09/22   Ladona Heinz, MD  ezetimibe -simvastatin  (VYTORIN ) 10-40 MG tablet TAKE 1 TABLET BY MOUTH EVERY DAY AT 6 PM 01/16/24   Conte, Tessa N, PA-C  fluticasone  (FLONASE ) 50 MCG/ACT nasal spray Place 2 sprays into both nostrils daily. Patient not  taking: Reported on 09/05/2024 06/01/24   Jha, Panav, MD  metoprolol  succinate (TOPROL -XL) 100 MG 24 hr tablet TAKE 1 TABLET BY MOUTH EVERY EVENING TAKE WITH OR immediately following A meal 01/16/24   Lucien Orren SAILOR, PA-C  olmesartan  (BENICAR ) 20 MG tablet Take 1 tablet (20 mg total) by mouth at bedtime. 04/20/24   Ladona Heinz, MD  pantoprazole  (PROTONIX ) 40 MG tablet Take 40 mg by mouth daily. 06/16/19   [provider]    Family History Family History  Problem Relation Age of Onset   Hypertension Mother    Heart attack Father 80       2 heart attacks   Hypertension Sister    Hypertension Sister    Hypertension Sister    Hypertension Brother     Social History Social History    Tobacco Use   Smoking status: Never   Smokeless tobacco: Never  Vaping Use   Vaping status: Never Used  Substance Use Topics   Alcohol use: No    Alcohol/week: 0.0 standard drinks of alcohol   Drug use: No     Allergies   Pravastatin and Rosuvastatin   Review of Systems Review of Systems  Constitutional:  Positive for activity change. Negative for appetite change, fatigue and fever.  Respiratory:  Negative for cough and shortness of breath.   Cardiovascular:  Negative for chest pain.  Gastrointestinal:  Positive for abdominal pain. Negative for diarrhea, nausea and vomiting.  Genitourinary:  Positive for dysuria, frequency and urgency. Negative for pelvic pain, vaginal bleeding, vaginal discharge and vaginal pain.  Musculoskeletal:  Negative for arthralgias and myalgias.     Physical Exam Triage Vital Signs ED Triage Vitals [09/05/24 1035]  Encounter Vitals Group     BP 118/82     Girls Systolic BP Percentile      Girls Diastolic BP Percentile      Boys Systolic BP Percentile      Boys Diastolic BP Percentile      Pulse Rate 74     Resp 16     Temp 98 F (36.7 C)     Temp Source Oral     SpO2 98 %     Weight      Height      Head Circumference      Peak Flow      Pain Score 7     Pain Loc      Pain Education      Exclude from Growth Chart    No data found.  Updated Vital Signs BP 118/82 (BP Location: Left Arm)   Pulse 74   Temp 98 F (36.7 C) (Oral)   Resp 16   LMP 03/21/2000   SpO2 98%   Visual Acuity Right Eye Distance:   Left Eye Distance:   Bilateral Distance:    Right Eye Near:   Left Eye Near:    Bilateral Near:     Physical Exam Vitals reviewed.  Constitutional:      General: She is awake. She is not in acute distress.    Appearance: Normal appearance. She is well-developed. She is not ill-appearing.     Comments: Very pleasant female appears stated age in no acute distress sitting comfortably in exam room  HENT:     Head:  Normocephalic and atraumatic.  Cardiovascular:     Rate and Rhythm: Normal rate and regular rhythm.     Heart sounds: Normal heart sounds, S1 normal and S2 normal. No murmur  heard. Pulmonary:     Effort: Pulmonary effort is normal.     Breath sounds: Normal breath sounds. No wheezing, rhonchi or rales.     Comments: Clear to auscultation bilaterally Abdominal:     General: Bowel sounds are normal.     Palpations: Abdomen is soft.     Tenderness: There is abdominal tenderness in the right lower quadrant, suprapubic area and left lower quadrant. There is no right CVA tenderness, left CVA tenderness, guarding or rebound. Negative signs include Rovsing's sign, McBurney's sign and psoas sign.     Comments: Tender to palpation throughout lower abdomen but worse in right lower quadrant.  No evidence of acute abdomen.  No CVA tenderness.  Psychiatric:        Behavior: Behavior is cooperative.      UC Treatments / Results  Labs (all labs ordered are listed, but only abnormal results are displayed) Labs Reviewed  POCT URINALYSIS DIP (MANUAL ENTRY) - Abnormal; Notable for the following components:      Result Value   Blood, UA trace-lysed (*)    Leukocytes, UA Small (1+) (*)    All other components within normal limits  URINE CULTURE    EKG   Radiology No results found.  Procedures Procedures (including critical care time)  Medications Ordered in UC Medications - No data to display  Initial Impression / Assessment and Plan / UC Course  I have reviewed the triage vital signs and the nursing notes.  Pertinent labs & imaging results that were available during my care of the patient were reviewed by me and considered in my medical decision making (see chart for details).     Patient is well-appearing, afebrile, nontoxic, nontachycardic.  Vital signs and physical exam are reassuring with no indication for emergent evaluation or imaging.  She did have some lower abdominal pain  including in the right lower quadrant but low suspicion for appendicitis given she did not have any evidence of an acute abdomen on physical exam.  We did discuss that we do not have the ability to rule out something more serious and so if she is not feeling better within 12 to 24 hours of starting antibiotics or if at any point she has worsening symptoms she is to go to the ER for additional evaluation to which she expressed understanding and agreement.   UA was obtained that was consistent with urinary tract infection.  Will start Bactrim  DS twice daily for 5 days.  Notification for dose adjustment based on metabolic panel from 10/25/2022 with a creatinine of 0.96 and calculated creatinine clearance of 101 mL/min.  We discussed that if she develops any rash or lesions she is to stop the medication to be seen immediately.  Will send this for culture and contact her if we need to discontinue or change her antibiotics based on culture results.  Recommend that she rest and drink plenty of fluid.  Discussed that if anything changes or worsens and she has a severe abdominal pain, fever, nausea, vomiting, weakness, persistent or worsening urinary symptoms she needs to be seen immediately.  Strict return precautions given.  Excuse note provided.   Final Clinical Impressions(s) / UC Diagnoses   Final diagnoses:  Acute cystitis with hematuria  Lower abdominal pain     Discharge Instructions      We are treating you for urinary tract infection.  Please start Bactrim  DS twice daily for 5 days.  If you develop any rash or lesions stop the  medication and be seen immediately.  Make sure you rest and drink plenty of fluid.  I will contact you if need to stop or change your antibiotics based on your culture results. There was some blood noted in your urinalysis and so I recommend that you follow-up with your primary care in 2 to 4 weeks to have this repeated and ensure that it goes away once we have treated the  infection. If anything worsens and you have abdominal pain, blood in your urine, nausea, vomiting, fever, weakness, persistent or worsening symptoms you should be seen immediately.  As we discussed, if your abdominal pain worsens or if you are not feeling better within a day please go to the ER.      ED Prescriptions     Medication Sig Dispense Auth. Provider   sulfamethoxazole -trimethoprim  (BACTRIM  DS) 800-160 MG tablet Take 1 tablet by mouth 2 (two) times daily for 5 days. 10 tablet Altie Savard K, PA-C      PDMP not reviewed this encounter.   Sherrell Rocky POUR, PA-C 09/05/24 1149

## 2024-09-06 LAB — URINE CULTURE: Culture: 40000 — AB

## 2024-09-07 ENCOUNTER — Ambulatory Visit: Payer: Self-pay

## 2024-09-16 ENCOUNTER — Encounter: Payer: Self-pay | Admitting: Podiatry

## 2024-09-16 ENCOUNTER — Ambulatory Visit (INDEPENDENT_AMBULATORY_CARE_PROVIDER_SITE_OTHER): Payer: Self-pay | Admitting: Podiatry

## 2024-09-16 ENCOUNTER — Ambulatory Visit (INDEPENDENT_AMBULATORY_CARE_PROVIDER_SITE_OTHER)

## 2024-09-16 DIAGNOSIS — M722 Plantar fascial fibromatosis: Secondary | ICD-10-CM

## 2024-09-16 DIAGNOSIS — M7732 Calcaneal spur, left foot: Secondary | ICD-10-CM

## 2024-09-16 DIAGNOSIS — M7662 Achilles tendinitis, left leg: Secondary | ICD-10-CM

## 2024-09-16 MED ORDER — TRIAMCINOLONE ACETONIDE 10 MG/ML IJ SUSP
10.0000 mg | Freq: Once | INTRAMUSCULAR | Status: AC
  Administered 2024-09-16: 10 mg

## 2024-09-16 NOTE — Progress Notes (Signed)
 Patient presents complaining of pain around the heel mostly in the posterior aspect.  Been bothering her for several months now.  Been getting worse.  Has not does not recall any specific injury to it.  Has not noticed any redness or ecchymosis.   Physical exam:  General appearance: Pleasant, and in no acute distress. AOx3.  Vascular: Pedal pulses: DP 2/4 bilaterally, PT 2/4 bilaterally.  Mild edema lower legs bilaterally. Capillary fill time immediate bilaterally.  Neurological: Light touch intact feet bilaterally.  Normal Achilles reflex bilaterally.  No clonus or spasticity noted.  Immediate bilaterally negative Tinel's sign tarsal tunnel negative Tinel sign tarsal tunnel and porta pedis bilaterally  Dermatologic:   Skin normal temperature bilaterally.  Skin normal color, tone, and texture bilaterally.   Musculoskeletal: Tenderness at the posterior aspect of the heel at the insertion of the Achilles tendon.  Tenderness on the Achilles tendon along the distal 2 to 3 cm of tendon.  No defects in Achilles tendon noted left.  Some tenderness at the plantar lateral calcaneal tubercle left.  No tenderness with lateral compression of calcaneus.  Normal muscle strength lower extremity left  Radiographs: 3 views foot left: Posterior and plantar calcaneal spurring.  No evidence of any fractures or dislocations.  kagar's no erosive changes around calcaneus noted.  No assenting bone tumors.  Triangle is intact.  Diagnosis: 1.  Achilles tendinitis left 2.  Plantar fasciitis left 3.  Calcaneal spur left  Plan: -New patient office visit for evaluation and management level 3.  Modifier 25. - Discussed the Achilles tendinitis and plantar fasciitis.  Will give exercises to do at home.  Will try an injection today.  Discussed proper shoes to wear.  Avoid flat soled shoes or going in bare feet. -Written instructions given for at home exercises and therapy - Dispensed night splint left. -injected 3cc  2:1 mixture 0.5 cc Marcaine :Kenolog 10mg /23ml around Achilles tendon insertion taking care not to violate the tendon left.    Return weeks follow-up injection Achilles tendon left

## 2024-09-16 NOTE — Patient Instructions (Signed)

## 2024-09-30 ENCOUNTER — Ambulatory Visit: Admitting: Podiatry

## 2024-10-01 ENCOUNTER — Other Ambulatory Visit: Payer: Self-pay | Admitting: Physician Assistant

## 2024-10-01 ENCOUNTER — Other Ambulatory Visit: Payer: Self-pay | Admitting: Cardiology

## 2024-10-01 ENCOUNTER — Ambulatory Visit: Admitting: Podiatry

## 2024-10-01 DIAGNOSIS — R931 Abnormal findings on diagnostic imaging of heart and coronary circulation: Secondary | ICD-10-CM

## 2024-10-01 DIAGNOSIS — E7800 Pure hypercholesterolemia, unspecified: Secondary | ICD-10-CM

## 2024-10-01 DIAGNOSIS — M7662 Achilles tendinitis, left leg: Secondary | ICD-10-CM

## 2024-10-01 DIAGNOSIS — I10 Essential (primary) hypertension: Secondary | ICD-10-CM

## 2024-10-01 DIAGNOSIS — I209 Angina pectoris, unspecified: Secondary | ICD-10-CM

## 2024-10-01 MED ORDER — MELOXICAM 15 MG PO TABS: 15.0000 mg | ORAL_TABLET | Freq: Every day | ORAL | 3 refills | Status: AC

## 2024-10-01 MED ORDER — METHYLPREDNISOLONE 4 MG PO TBPK: ORAL_TABLET | ORAL | 0 refills | Status: AC

## 2024-10-02 NOTE — Progress Notes (Signed)
 She presents today for follow-up of her left foot pain.  She states that she still has pain in the left posterior lateral aspect of her Achilles.  She states noticed improvement with high heels but that shot Dr. Gabriel gave me did not work at all.  Objective: Vital signs stable oriented x 3.  Pulses are palpable.  Has severe pain on palpation of the superior margin of the calcaneus where the of Achilles tendon attaches.  There is an area of fluctuance and here consistent with bursitis.  Is warm to the touch.  Exquisitely painful from the posterior superior aspect of the calcaneus to the inferior aspect of the calcaneus.  Assessment: Achilles tendinitis left.  Plan: Discussed etiology pathology conservative surgical therapies at this point placed her in a short cam boot started her on methylprednisolone to be followed by meloxicam.  Discussed ice therapy and I will follow-up with her in 6 weeks if not improved consider MRI.

## 2024-11-17 ENCOUNTER — Ambulatory Visit: Admitting: Podiatry

## 2024-11-24 ENCOUNTER — Telehealth: Payer: Self-pay | Admitting: Lab

## 2024-11-24 ENCOUNTER — Ambulatory Visit: Admitting: Podiatry

## 2024-11-24 ENCOUNTER — Encounter: Payer: Self-pay | Admitting: Podiatry

## 2024-11-24 DIAGNOSIS — M7662 Achilles tendinitis, left leg: Secondary | ICD-10-CM | POA: Diagnosis not present

## 2024-11-24 DIAGNOSIS — S86012A Strain of left Achilles tendon, initial encounter: Secondary | ICD-10-CM | POA: Diagnosis not present

## 2024-11-24 NOTE — Progress Notes (Signed)
 She presents today for follow-up of her Achilles tendinitis of her left foot.  States that it is better but is not well feel that as long as I am in the boot I do okay as soon as I come out of the boot it really is painful again.  She is trying to ice and utilize anti-inflammatories as well as use of the boot she states that is just not helping.  Objective: Vital signs are stable alert and oriented x 3 pulses are palpable.  Left posterior foot is exquisitely warm to the touch.  No open lesions or wounds are noted.  She has tenderness on palpation of the margins of the Achilles particularly the lateral posterior margin of the calcaneus at the Achilles insertion.  Assessment: Probable tear of the Achilles tendon particularly the intrinsic fibers.  Currently we are going to request an MRI of the left heel I requested that she stay in her cam boot and continue to use ice and anti-inflammatories until we have the results.

## 2024-11-24 NOTE — Telephone Encounter (Signed)
 Patient left message stating that MRI has been ordered was notified by [facility] but states her pain is in the heel not ankle and would like a call back to clarify.

## 2024-12-16 ENCOUNTER — Other Ambulatory Visit: Payer: Self-pay

## 2024-12-16 DIAGNOSIS — S86012A Strain of left Achilles tendon, initial encounter: Secondary | ICD-10-CM

## 2024-12-28 ENCOUNTER — Ambulatory Visit: Payer: Self-pay | Admitting: Podiatry

## 2025-01-06 ENCOUNTER — Telehealth: Payer: Self-pay | Admitting: Cardiology

## 2025-01-06 ENCOUNTER — Other Ambulatory Visit: Payer: Self-pay | Admitting: Physician Assistant

## 2025-01-06 DIAGNOSIS — E7800 Pure hypercholesterolemia, unspecified: Secondary | ICD-10-CM

## 2025-01-06 DIAGNOSIS — R931 Abnormal findings on diagnostic imaging of heart and coronary circulation: Secondary | ICD-10-CM

## 2025-01-06 DIAGNOSIS — I10 Essential (primary) hypertension: Secondary | ICD-10-CM

## 2025-01-06 DIAGNOSIS — I209 Angina pectoris, unspecified: Secondary | ICD-10-CM

## 2025-01-06 NOTE — Telephone Encounter (Signed)
 Refill  *STAT* If patient is at the pharmacy, call can be transferred to refill team.     1. Which medications need to be refilled? (please list name of each medication and dose if known) ezetimibe -simvastatin  (VYTORIN ) 10-40 MG tablet    metoprolol  succinate (TOPROL -XL) 100 MG 24 hr tablet    2. Would you like to learn more about the convenience, safety, & potential cost savings by using the [facility]?      3. Are you open to using the [facility] (Type [facility]. ). No     4. Which pharmacy/location (including street and city if local pharmacy) is medication to be sent to?[facility] - [place], [place] - [name]      5. Do they need a 30 day or 90 day supply? 90 day

## 2025-01-07 MED ORDER — METOPROLOL SUCCINATE ER 100 MG PO TB24: 100.0000 mg | ORAL_TABLET | Freq: Every day | ORAL | 0 refills | Status: AC

## 2025-01-07 MED ORDER — EZETIMIBE-SIMVASTATIN 10-40 MG PO TABS: 1.0000 | ORAL_TABLET | Freq: Every day | ORAL | 3 refills | Status: DC

## 2025-01-07 MED ORDER — METOPROLOL SUCCINATE ER 100 MG PO TB24: 100.0000 mg | ORAL_TABLET | Freq: Every day | ORAL | 3 refills | Status: DC

## 2025-01-07 MED ORDER — EZETIMIBE-SIMVASTATIN 10-40 MG PO TABS: 1.0000 | ORAL_TABLET | Freq: Every day | ORAL | 0 refills | Status: AC

## 2025-01-07 NOTE — Telephone Encounter (Signed)
 Pt's medications were sent to pt's pharmacy as requested. Confirmation received.
